# Patient Record
Sex: Male | Born: 1960 | Race: Black or African American | Hispanic: No | Marital: Married | Smoking: Former smoker
Health system: Southern US, Community
[De-identification: ages and names within clinical notes are randomized; demographics above are authoritative.]

## PROBLEM LIST (undated history)

## (undated) DIAGNOSIS — J189 Pneumonia, unspecified organism: Secondary | ICD-10-CM

## (undated) DIAGNOSIS — J984 Other disorders of lung: Secondary | ICD-10-CM

## (undated) DIAGNOSIS — F17200 Nicotine dependence, unspecified, uncomplicated: Secondary | ICD-10-CM

## (undated) DIAGNOSIS — M5136 Other intervertebral disc degeneration, lumbar region: Secondary | ICD-10-CM

## (undated) DIAGNOSIS — K219 Gastro-esophageal reflux disease without esophagitis: Secondary | ICD-10-CM

## (undated) DIAGNOSIS — M1990 Unspecified osteoarthritis, unspecified site: Secondary | ICD-10-CM

## (undated) HISTORY — DX: Unspecified osteoarthritis, unspecified site: M19.90

## (undated) HISTORY — DX: Gastro-esophageal reflux disease without esophagitis: K21.9

## (undated) HISTORY — DX: Other intervertebral disc degeneration, lumbar region: M51.36

## (undated) HISTORY — DX: Nicotine dependence, unspecified, uncomplicated: F17.200

---

## 2001-04-09 ENCOUNTER — Emergency Department (HOSPITAL_COMMUNITY): Admission: EM | Admit: 2001-04-09 | Discharge: 2001-04-09 | Payer: Self-pay | Admitting: Emergency Medicine

## 2001-04-09 ENCOUNTER — Encounter: Payer: Self-pay | Admitting: Emergency Medicine

## 2002-08-09 HISTORY — PX: BACK SURGERY: SHX140

## 2003-03-12 ENCOUNTER — Encounter: Payer: Self-pay | Admitting: Internal Medicine

## 2003-03-12 ENCOUNTER — Ambulatory Visit (HOSPITAL_COMMUNITY): Admission: RE | Admit: 2003-03-12 | Discharge: 2003-03-12 | Payer: Self-pay | Admitting: Internal Medicine

## 2003-04-09 ENCOUNTER — Encounter: Payer: Self-pay | Admitting: Internal Medicine

## 2003-04-09 ENCOUNTER — Ambulatory Visit (HOSPITAL_COMMUNITY): Admission: RE | Admit: 2003-04-09 | Discharge: 2003-04-09 | Payer: Self-pay | Admitting: Internal Medicine

## 2003-06-13 ENCOUNTER — Inpatient Hospital Stay (HOSPITAL_COMMUNITY): Admission: RE | Admit: 2003-06-13 | Discharge: 2003-06-14 | Payer: Self-pay | Admitting: Orthopedic Surgery

## 2003-12-07 ENCOUNTER — Emergency Department (HOSPITAL_COMMUNITY): Admission: EM | Admit: 2003-12-07 | Discharge: 2003-12-07 | Payer: Self-pay | Admitting: Emergency Medicine

## 2004-02-12 ENCOUNTER — Ambulatory Visit (HOSPITAL_COMMUNITY): Admission: RE | Admit: 2004-02-12 | Discharge: 2004-02-12 | Payer: Self-pay | Admitting: Internal Medicine

## 2004-02-19 ENCOUNTER — Ambulatory Visit (HOSPITAL_COMMUNITY): Admission: RE | Admit: 2004-02-19 | Discharge: 2004-02-19 | Payer: Self-pay | Admitting: Internal Medicine

## 2004-06-01 ENCOUNTER — Emergency Department (HOSPITAL_COMMUNITY): Admission: EM | Admit: 2004-06-01 | Discharge: 2004-06-02 | Payer: Self-pay | Admitting: Emergency Medicine

## 2004-06-01 ENCOUNTER — Ambulatory Visit: Payer: Self-pay | Admitting: *Deleted

## 2004-06-16 ENCOUNTER — Ambulatory Visit: Payer: Self-pay | Admitting: Family Medicine

## 2004-07-07 ENCOUNTER — Ambulatory Visit: Payer: Self-pay | Admitting: Family Medicine

## 2004-07-14 ENCOUNTER — Ambulatory Visit: Payer: Self-pay | Admitting: Family Medicine

## 2004-07-26 ENCOUNTER — Emergency Department (HOSPITAL_COMMUNITY): Admission: EM | Admit: 2004-07-26 | Discharge: 2004-07-26 | Payer: Self-pay | Admitting: Family Medicine

## 2004-09-02 ENCOUNTER — Ambulatory Visit: Payer: Self-pay | Admitting: Family Medicine

## 2004-09-14 ENCOUNTER — Ambulatory Visit: Payer: Self-pay | Admitting: Family Medicine

## 2004-09-24 ENCOUNTER — Ambulatory Visit: Payer: Self-pay | Admitting: Family Medicine

## 2004-11-02 ENCOUNTER — Ambulatory Visit: Payer: Self-pay | Admitting: Family Medicine

## 2004-11-13 ENCOUNTER — Ambulatory Visit: Payer: Self-pay | Admitting: Family Medicine

## 2004-11-26 ENCOUNTER — Ambulatory Visit: Payer: Self-pay | Admitting: Family Medicine

## 2004-12-11 ENCOUNTER — Ambulatory Visit: Payer: Self-pay | Admitting: Family Medicine

## 2004-12-15 ENCOUNTER — Emergency Department (HOSPITAL_COMMUNITY): Admission: EM | Admit: 2004-12-15 | Discharge: 2004-12-15 | Payer: Self-pay | Admitting: Emergency Medicine

## 2004-12-16 ENCOUNTER — Ambulatory Visit: Payer: Self-pay | Admitting: Family Medicine

## 2005-01-05 ENCOUNTER — Encounter: Admission: RE | Admit: 2005-01-05 | Discharge: 2005-01-05 | Payer: Self-pay | Admitting: Orthopedic Surgery

## 2005-01-25 ENCOUNTER — Ambulatory Visit: Payer: Self-pay | Admitting: Family Medicine

## 2005-03-02 ENCOUNTER — Ambulatory Visit: Payer: Self-pay | Admitting: Family Medicine

## 2005-03-09 ENCOUNTER — Ambulatory Visit: Payer: Self-pay | Admitting: Family Medicine

## 2005-03-20 ENCOUNTER — Emergency Department (HOSPITAL_COMMUNITY): Admission: AD | Admit: 2005-03-20 | Discharge: 2005-03-20 | Payer: Self-pay | Admitting: Family Medicine

## 2005-03-23 ENCOUNTER — Ambulatory Visit: Payer: Self-pay | Admitting: Family Medicine

## 2005-03-26 ENCOUNTER — Emergency Department (HOSPITAL_COMMUNITY): Admission: EM | Admit: 2005-03-26 | Discharge: 2005-03-26 | Payer: Self-pay | Admitting: Family Medicine

## 2005-09-11 ENCOUNTER — Emergency Department (HOSPITAL_COMMUNITY): Admission: EM | Admit: 2005-09-11 | Discharge: 2005-09-11 | Payer: Self-pay | Admitting: Emergency Medicine

## 2005-12-14 ENCOUNTER — Emergency Department (HOSPITAL_COMMUNITY): Admission: EM | Admit: 2005-12-14 | Discharge: 2005-12-14 | Payer: Self-pay | Admitting: Family Medicine

## 2006-01-10 ENCOUNTER — Emergency Department (HOSPITAL_COMMUNITY): Admission: EM | Admit: 2006-01-10 | Discharge: 2006-01-10 | Payer: Self-pay | Admitting: Family Medicine

## 2006-03-09 ENCOUNTER — Emergency Department (HOSPITAL_COMMUNITY): Admission: EM | Admit: 2006-03-09 | Discharge: 2006-03-09 | Payer: Self-pay | Admitting: Emergency Medicine

## 2007-11-13 ENCOUNTER — Emergency Department (HOSPITAL_COMMUNITY): Admission: EM | Admit: 2007-11-13 | Discharge: 2007-11-13 | Payer: Self-pay | Admitting: Emergency Medicine

## 2008-01-23 ENCOUNTER — Emergency Department (HOSPITAL_COMMUNITY): Admission: EM | Admit: 2008-01-23 | Discharge: 2008-01-23 | Payer: Self-pay | Admitting: Emergency Medicine

## 2008-10-28 ENCOUNTER — Emergency Department (HOSPITAL_COMMUNITY): Admission: EM | Admit: 2008-10-28 | Discharge: 2008-10-28 | Payer: Self-pay | Admitting: Emergency Medicine

## 2009-08-14 ENCOUNTER — Emergency Department (HOSPITAL_COMMUNITY): Admission: EM | Admit: 2009-08-14 | Discharge: 2009-08-14 | Payer: Self-pay | Admitting: Emergency Medicine

## 2009-09-26 ENCOUNTER — Emergency Department (HOSPITAL_COMMUNITY): Admission: EM | Admit: 2009-09-26 | Discharge: 2009-09-26 | Payer: Self-pay | Admitting: Family Medicine

## 2010-06-25 ENCOUNTER — Emergency Department (HOSPITAL_COMMUNITY): Admission: EM | Admit: 2010-06-25 | Discharge: 2010-06-25 | Payer: Self-pay | Admitting: Emergency Medicine

## 2010-06-27 ENCOUNTER — Emergency Department (HOSPITAL_COMMUNITY): Admission: EM | Admit: 2010-06-27 | Discharge: 2010-06-27 | Payer: Self-pay | Admitting: Emergency Medicine

## 2010-10-19 ENCOUNTER — Other Ambulatory Visit (HOSPITAL_COMMUNITY): Payer: Self-pay | Admitting: Family Medicine

## 2010-10-19 DIAGNOSIS — M545 Low back pain: Secondary | ICD-10-CM

## 2010-10-20 LAB — URINALYSIS, ROUTINE W REFLEX MICROSCOPIC
Bilirubin Urine: NEGATIVE
Bilirubin Urine: NEGATIVE
Glucose, UA: NEGATIVE mg/dL
Glucose, UA: NEGATIVE mg/dL
Hgb urine dipstick: NEGATIVE
Ketones, ur: NEGATIVE mg/dL
Ketones, ur: NEGATIVE mg/dL
Leukocytes, UA: NEGATIVE
Nitrite: NEGATIVE
Nitrite: NEGATIVE
Protein, ur: NEGATIVE mg/dL
Protein, ur: NEGATIVE mg/dL
Specific Gravity, Urine: 1.021 (ref 1.005–1.030)
Specific Gravity, Urine: 1.027 (ref 1.005–1.030)
Urobilinogen, UA: 0.2 mg/dL (ref 0.0–1.0)
Urobilinogen, UA: 0.2 mg/dL (ref 0.0–1.0)
pH: 6 (ref 5.0–8.0)
pH: 6 (ref 5.0–8.0)

## 2010-10-20 LAB — HEPATIC FUNCTION PANEL
ALT: 24 U/L (ref 0–53)
AST: 26 U/L (ref 0–37)
Albumin: 3.6 g/dL (ref 3.5–5.2)
Alkaline Phosphatase: 49 U/L (ref 39–117)
Bilirubin, Direct: 0.2 mg/dL (ref 0.0–0.3)
Indirect Bilirubin: 0.4 mg/dL (ref 0.3–0.9)
Total Bilirubin: 0.6 mg/dL (ref 0.3–1.2)
Total Protein: 6.2 g/dL (ref 6.0–8.3)

## 2010-10-20 LAB — BASIC METABOLIC PANEL
BUN: 6 mg/dL (ref 6–23)
CO2: 31 mEq/L (ref 19–32)
Calcium: 8.8 mg/dL (ref 8.4–10.5)
Chloride: 106 mEq/L (ref 96–112)
Creatinine, Ser: 0.93 mg/dL (ref 0.4–1.5)
GFR calc Af Amer: >60 mL/min (ref >60)
GFR calc non Af Amer: >60 mL/min (ref >60)
Glucose, Bld: 99 mg/dL (ref 70–99)
Potassium: 3.9 mEq/L (ref 3.5–5.1)
Sodium: 141 mEq/L (ref 135–145)

## 2010-10-20 LAB — CBC
HCT: 39.7 % (ref 39.0–52.0)
Hemoglobin: 14.3 g/dL (ref 13.0–17.0)
MCH: 32.2 pg (ref 26.0–34.0)
MCHC: 36 g/dL (ref 30.0–36.0)
MCV: 89.4 fL (ref 78.0–100.0)
Platelets: 262 10*3/uL (ref 150–400)
RBC: 4.44 MIL/uL (ref 4.22–5.81)
RDW: 12.9 % (ref 11.5–15.5)
WBC: 9.4 10*3/uL (ref 4.0–10.5)

## 2010-10-20 LAB — URINE MICROSCOPIC-ADD ON

## 2010-10-20 LAB — URINE CULTURE
Colony Count: NO GROWTH
Culture  Setup Time: 201111191258
Culture: NO GROWTH

## 2010-10-20 LAB — DIFFERENTIAL
Basophils Absolute: 0 10*3/uL (ref 0.0–0.1)
Basophils Relative: 0 % (ref 0–1)
Eosinophils Absolute: 0.2 10*3/uL (ref 0.0–0.7)
Eosinophils Relative: 2 % (ref 0–5)
Lymphocytes Relative: 26 % (ref 12–46)
Lymphs Abs: 2.4 10*3/uL (ref 0.7–4.0)
Monocytes Absolute: 0.6 10*3/uL (ref 0.1–1.0)
Monocytes Relative: 7 % (ref 3–12)
Neutro Abs: 6.2 10*3/uL (ref 1.7–7.7)
Neutrophils Relative %: 66 % (ref 43–77)

## 2010-10-20 LAB — RAPID URINE DRUG SCREEN, HOSP PERFORMED
Amphetamines: NOT DETECTED
Barbiturates: NOT DETECTED
Benzodiazepines: POSITIVE — AB
Cocaine: NOT DETECTED
Opiates: POSITIVE — AB
Tetrahydrocannabinol: NOT DETECTED

## 2010-10-20 LAB — LIPASE, BLOOD: Lipase: 19 U/L (ref 11–59)

## 2010-10-22 ENCOUNTER — Ambulatory Visit (HOSPITAL_COMMUNITY)
Admission: RE | Admit: 2010-10-22 | Discharge: 2010-10-22 | Disposition: A | Discharge status: Home / Self Care | Payer: Self-pay | Source: Ambulatory Visit | Attending: Family Medicine | Admitting: Family Medicine

## 2010-10-22 DIAGNOSIS — M545 Low back pain, unspecified: Secondary | ICD-10-CM | POA: Insufficient documentation

## 2010-10-22 DIAGNOSIS — M48061 Spinal stenosis, lumbar region without neurogenic claudication: Secondary | ICD-10-CM | POA: Insufficient documentation

## 2010-10-22 DIAGNOSIS — R209 Unspecified disturbances of skin sensation: Secondary | ICD-10-CM | POA: Insufficient documentation

## 2010-10-22 DIAGNOSIS — M4804 Spinal stenosis, thoracic region: Secondary | ICD-10-CM | POA: Insufficient documentation

## 2010-10-22 DIAGNOSIS — M4310 Spondylolisthesis, site unspecified: Secondary | ICD-10-CM | POA: Insufficient documentation

## 2010-10-22 DIAGNOSIS — Q446 Cystic disease of liver: Secondary | ICD-10-CM | POA: Insufficient documentation

## 2010-10-22 LAB — CREATININE, SERUM
Creatinine, Ser: 1.2 mg/dL (ref 0.4–1.5)
GFR calc Af Amer: >60 mL/min (ref >60)
GFR calc non Af Amer: >60 mL/min (ref >60)

## 2010-10-22 MED ORDER — GADOBENATE DIMEGLUMINE 529 MG/ML IV SOLN
15.0000 mL | Freq: Once | INTRAVENOUS | Status: AC | PRN
  Administered 2010-10-22: 15 mL via INTRAVENOUS

## 2010-11-19 LAB — RPR: RPR Ser Ql: NONREACTIVE

## 2010-11-19 LAB — GC/CHLAMYDIA PROBE AMP, GENITAL: GC Probe Amp, Genital: POSITIVE — AB

## 2010-12-25 NOTE — Op Note (Signed)
NAME:  Joshua Baldwin, Joshua Baldwin                       ACCOUNT NO.:  [id number]   MEDICAL RECORD NO.:  [id number]                   PATIENT TYPE:  AMB   LOCATION:  DAY                                  FACILITY:  [facility]   PHYSICIAN:  Georges Lynch. Gioffre, M.D.             DATE OF BIRTH:  [birth year]   DATE OF PROCEDURE:  06/12/2003  DATE OF DISCHARGE:                                 OPERATIVE REPORT   PREOPERATIVE DIAGNOSES:  1. Spinal stenosis, L3-4.  2. 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  He had bilateral leg pain     preoperatively.   POSTOPERATIVE DIAGNOSES:  1. Spinal stenosis, L3-4.  2. 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  He had bilateral leg pain     preoperatively.   OPERATION:  Central decompression at L3-4 with a microdiskectomy bilaterally  at L3-4.   SURGEON:  Georges Lynch. Darrelyn Hillock, M.D.   ASSISTANT:  Jene Every, M.D.   DESCRIPTION OF PROCEDURE:  Under general anesthesia, the patient in a spinal  frame, he first had 1 g of IV Ancef.  A routine orthopedic prep and drape of  the back was carried out.  At this time two needles were placed in the back  for localization purposes, x-ray was taken.  Following that, incision was  made over L3-4.  The incision was carried down through to the laminae and  spinous processes.  I then took another x-ray and we had then identified the  exact L3-4 level.  I then went down and did a central decompression at L3-4  after the self-retaining McCullough retractors were inserted.  Following  this we protected the dura with the cottonoids and then removed the  ligamentum flavum in the usual fashion.  At this particular time we  identified a nerve root, gently retracted the root and the dura.  We did  foraminotomies.  We identified the disk space bilaterally and did  microdiskectomies bilaterally at L3-4.  Note he had a huge amount of disk  material that migrated proximally over the body of L3.  We teased that out  with a nerve hook and  then the Epstein curettes.  We then went down in the  disk space and completed a microdiskectomy.  The nerve roots and the dura  now were free.  We thoroughly irrigated out the area.  We loosely applied  some thrombin-soaked Gelfoam.  I inserted a quarter-inch Penrose drain and  at this time I closed the wound in layers in the usual fashion but I left  the proximal deep portion of the wound open for drainage purposes.  The  remaining subcu and skin were closed in the usual fashion.  A sterile  Neosporin dressing was applied an the patient left the operating room in  satisfactory condition.  Ronald A. Darrelyn Hillock, M.D.    RAG/MEDQ  D:  06/12/2003  T:  06/12/2003  Job:  [id number]

## 2011-05-04 LAB — POCT URINALYSIS DIP (DEVICE)
Hgb urine dipstick: NEGATIVE
Protein, ur: 30 — AB
pH: 6

## 2011-09-24 ENCOUNTER — Encounter (HOSPITAL_COMMUNITY): Payer: Self-pay | Admitting: Emergency Medicine

## 2011-09-24 ENCOUNTER — Emergency Department (HOSPITAL_COMMUNITY)
Admission: EM | Admit: 2011-09-24 | Discharge: 2011-09-24 | Disposition: A | Discharge status: Home / Self Care | Payer: Medicaid Other | Attending: Emergency Medicine | Admitting: Emergency Medicine

## 2011-09-24 DIAGNOSIS — M545 Low back pain, unspecified: Secondary | ICD-10-CM | POA: Insufficient documentation

## 2011-09-24 DIAGNOSIS — G8929 Other chronic pain: Secondary | ICD-10-CM | POA: Insufficient documentation

## 2011-09-24 DIAGNOSIS — M25559 Pain in unspecified hip: Secondary | ICD-10-CM | POA: Insufficient documentation

## 2011-09-24 DIAGNOSIS — R61 Generalized hyperhidrosis: Secondary | ICD-10-CM | POA: Insufficient documentation

## 2011-09-24 DIAGNOSIS — F17200 Nicotine dependence, unspecified, uncomplicated: Secondary | ICD-10-CM | POA: Insufficient documentation

## 2011-09-24 DIAGNOSIS — R209 Unspecified disturbances of skin sensation: Secondary | ICD-10-CM | POA: Insufficient documentation

## 2011-09-24 MED ORDER — KETOROLAC TROMETHAMINE 60 MG/2ML IM SOLN
60.0000 mg | Freq: Once | INTRAMUSCULAR | Status: AC
  Administered 2011-09-24: 60 mg via INTRAMUSCULAR
  Filled 2011-09-24: qty 2

## 2011-09-24 MED ORDER — INDOMETHACIN 25 MG PO CAPS: 25.0000 mg | ORAL_CAPSULE | Freq: Two times a day (BID) | ORAL | Status: DC

## 2011-09-24 MED ORDER — HYDROCODONE-ACETAMINOPHEN 5-325 MG PO TABS: 1.0000 | ORAL_TABLET | ORAL | Status: AC | PRN

## 2011-09-24 NOTE — ED Provider Notes (Signed)
History     CSN: [id number]  Arrival date & time 09/24/11  0915   First MD Initiated Contact with Patient 09/24/11 [phone]      Chief Complaint  Patient presents with  . Back Pain    (Consider location/radiation/quality/duration/timing/severity/associated sxs/prior treatment) HPI History provided by pt.   Pt presents w/ chronic low back pain.  Is out of his pain medication and next scheduled appointment w/ his orthopedist in 2 months.  Pain diffuse low back w/ radiation to bilateral hips and associated RLE paresthesias.  Denies fever but has had night sweats (has been addressed by his doctor) and denies bladder/bowel dysfunction and lower extremity weakness.  Has been taking vicodin for pain with some relief but this tears up his stomach.     Past Medical History  Diagnosis Date  . Back pain     Past Surgical History  Procedure Date  . Back surgery     No family history on file.  History  Substance Use Topics  . Smoking status: Current Everyday Smoker  . Smokeless tobacco: Not on file  . Alcohol Use: Yes      Review of Systems  All other systems reviewed and are negative.    Allergies  Review of patient's allergies indicates no known allergies.  Home Medications   Current Outpatient Rx  Name Route Sig Dispense Refill  . HYDROCODONE-ACETAMINOPHEN 5-325 MG PO TABS Oral Take 1 tablet by mouth every 6 (six) hours as needed. For pain    . PREDNISONE (PAK) 5 MG PO TABS Oral Take 5 mg by mouth daily. Dose for 6 days.  Taper dose      BP 117/81  Pulse 96  Temp(Src) 98.9 F (37.2 C) (Oral)  Resp 20  SpO2 100%  Physical Exam  Nursing note and vitals reviewed. Constitutional: He is oriented to person, place, and time. He appears well-developed and well-nourished.  HENT:  Head: Normocephalic and atraumatic.  Eyes:       Normal appearance  Neck: Normal range of motion.  Cardiovascular: Normal rate and regular rhythm.   Pulmonary/Chest: Effort normal and breath  sounds normal.  Musculoskeletal:       Surgical scar midline lumbar spine.  Lumbar spinal and paraspinal ttp. No CVA ttp. Full ROM of LE.  Nml patellar reflexes.  No saddle anesthesia. Distal sensation intact.  2+ DP pulses.  Ambulates w/out diffulty.   Neurological: He is alert and oriented to person, place, and time.  Skin: Skin is warm and dry. No rash noted.  Psychiatric: He has a normal mood and affect. His behavior is normal.    ED Course  Procedures (including critical care time)  Labs Reviewed - No data to display No results found.   1. Chronic low back pain       MDM  Pt presents w/ acute on chronic low back pain.  His wife is being seen for same.  Sees Dr. Juliene Pina.  I have called his office and pt most recently seen 09/06/11 and d/c'd home w/ indomethacin.  He does not have a f/u appt scheduled.  Pt received IM toradol in ED and d/c'd home w/ 12 vicodin.  Recommended f/u with ortho as well as his PCP ([facility].  He is in the process of finding a pain specialist.  Return precautions discussed.         Arie Sabina [place], [place] 09/24/11 [phone]

## 2011-09-24 NOTE — ED Notes (Signed)
Ran out of pain meds for back last week cannot get into see dr for 2 months

## 2011-09-24 NOTE — Discharge Instructions (Signed)
Take indomethacin as prescribed.  Take vicodin as prescribed for severe pain.   Do not drive within four hours of taking this medication (may cause drowsiness or confusion).  Follow up with Dr. Juliene Pina and/or your family doctor and the pain specialist as soon as possible.  You should return to the ER if you develop at temperature greater than 100.5, leg weakness or loss of control of bladder/bowels.

## 2011-09-24 NOTE — ED Notes (Signed)
Patient states history of lower back pain with surgery. States had a MRI of his back in 2012.  Currently pain lower back 710 achy sharp and ran out of pain medication.  Unable to see surgeon for 2 months and pain is worsening overtime.  Denies incontinence intermittent tingling bilateral hands and feet.  Airway intact bilateral equal chest rise and fall. Ax4.

## 2011-09-24 NOTE — ED Provider Notes (Signed)
Medical screening examination/treatment/procedure(s) were performed by non-physician practitioner and as supervising physician I was immediately available for consultation/collaboration.   Louis Gaw, MD 09/24/11 2004 

## 2011-10-18 ENCOUNTER — Emergency Department (HOSPITAL_COMMUNITY)
Admission: EM | Admit: 2011-10-18 | Discharge: 2011-10-18 | Disposition: A | Discharge status: Home / Self Care | Payer: Medicaid Other | Attending: Emergency Medicine | Admitting: Emergency Medicine

## 2011-10-18 ENCOUNTER — Encounter (HOSPITAL_COMMUNITY): Payer: Self-pay | Admitting: *Deleted

## 2011-10-18 DIAGNOSIS — M25559 Pain in unspecified hip: Secondary | ICD-10-CM | POA: Insufficient documentation

## 2011-10-18 DIAGNOSIS — F17200 Nicotine dependence, unspecified, uncomplicated: Secondary | ICD-10-CM | POA: Insufficient documentation

## 2011-10-18 DIAGNOSIS — M549 Dorsalgia, unspecified: Secondary | ICD-10-CM

## 2011-10-18 MED ORDER — OXYCODONE-ACETAMINOPHEN 5-325 MG PO TABS: 1.0000 | ORAL_TABLET | ORAL | Status: AC | PRN

## 2011-10-18 NOTE — ED Notes (Signed)
States took one of mom's oxycodone last night and helped with pain. Has taken hydrocodone in past without relief.

## 2011-10-18 NOTE — ED Notes (Signed)
Pt c/o right hip pain, has had previous back surgery and having sciatica pain in lower back and hip. Seen Dr. Myra Rude for pain, given steroid pack, can't get appt with MD April 28th, unable to tolerate pain.

## 2011-10-18 NOTE — Discharge Instructions (Signed)
FOLLOW UP WITH YOUR DOCTOR FOR FURTHER MANAGEMENT OF CHRONIC PAIN. PERCOCET AS PRESCRIBED.

## 2011-10-18 NOTE — Progress Notes (Signed)
10/18/11 confirmed Dr Darrelyn Hillock is primary care provider/did his surgery. Pending disability for a court date on next Wednesday October 27 2011 and then he will get pain management provider for his back pain

## 2011-10-18 NOTE — ED Provider Notes (Signed)
History     CSN: [id number]  Arrival date & time 10/18/11  1139   First MD Initiated Contact with Patient 10/18/11 1231      Chief Complaint  Patient presents with  . Hip Pain    (Consider location/radiation/quality/duration/timing/severity/associated sxs/prior treatment) Patient is a 51 y.o. male presenting with hip pain. The history is provided by the patient.  Hip Pain This is a chronic problem. The problem occurs constantly. The problem has been unchanged. Pertinent negatives include no chills or fever. Associated symptoms comments: History of back surgery with chronic pain that radiates to right hip and buttock. No numbness, weakness. No new injury.. The symptoms are aggravated by walking, twisting and standing.    Past Medical History  Diagnosis Date  . Back pain     Past Surgical History  Procedure Date  . Back surgery     No family history on file.  History  Substance Use Topics  . Smoking status: Current Everyday Smoker  . Smokeless tobacco: Not on file  . Alcohol Use: Yes      Review of Systems  Constitutional: Negative for fever and chills.  HENT: Negative.   Respiratory: Negative.   Cardiovascular: Negative.   Gastrointestinal: Negative.   Musculoskeletal: Negative.        See HPI  Skin: Negative.   Neurological: Negative.     Allergies  Review of patient's allergies indicates no known allergies.  Home Medications   Current Outpatient Rx  Name Route Sig Dispense Refill  . VITAMIN B12 PO Oral Take 1 tablet by mouth daily.    . OXYCODONE-ACETAMINOPHEN 5-325 MG PO TABS Oral Take 0.5 tablets by mouth once.      BP 114/56  Pulse 84  Temp 99 F (37.2 C)  Resp 18  Ht 5\' 5"  (1.651 m)  Wt 156 lb (70.761 kg)  BMI 25.96 kg/m2  SpO2 99%  Physical Exam  Constitutional: He is oriented to person, place, and time. He appears well-developed and well-nourished.  Neck: Normal range of motion.  Pulmonary/Chest: Effort normal.  Abdominal: Soft. He  exhibits no mass. There is no tenderness.  Musculoskeletal: Normal range of motion. He exhibits no edema.       Right paralumbar tenderness without swelling, discoloration. Sciatic tenderness on right. Distal pulses 2+.  Neurological: He is alert and oriented to person, place, and time. He has normal reflexes. No sensory deficit.  Skin: Skin is warm and dry.  Psychiatric: He has a normal mood and affect.    ED Course  Procedures (including critical care time)  Labs Reviewed - No data to display No results found.   No diagnosis found.    MDM          Rodena Medin, PA-C 10/18/11 1316

## 2011-11-04 NOTE — ED Provider Notes (Signed)
Evaluation and management procedures were performed by the PA/NP under my supervision/collaboration.   Felisa Bonier, MD 11/04/11 1011

## 2011-12-29 ENCOUNTER — Emergency Department (HOSPITAL_COMMUNITY)
Admission: EM | Admit: 2011-12-29 | Discharge: 2011-12-29 | Disposition: A | Discharge status: Home / Self Care | Payer: Self-pay | Attending: Emergency Medicine | Admitting: Emergency Medicine

## 2011-12-29 ENCOUNTER — Encounter (HOSPITAL_COMMUNITY): Payer: Self-pay

## 2011-12-29 ENCOUNTER — Emergency Department (HOSPITAL_COMMUNITY): Payer: Self-pay

## 2011-12-29 DIAGNOSIS — M25559 Pain in unspecified hip: Secondary | ICD-10-CM | POA: Insufficient documentation

## 2011-12-29 DIAGNOSIS — F17200 Nicotine dependence, unspecified, uncomplicated: Secondary | ICD-10-CM | POA: Insufficient documentation

## 2011-12-29 MED ORDER — METHOCARBAMOL 100 MG/ML IJ SOLN
1000.0000 mg | Freq: Once | INTRAVENOUS | Status: DC
  Filled 2011-12-29: qty 10

## 2011-12-29 MED ORDER — METHOCARBAMOL 100 MG/ML IJ SOLN
1000.0000 mg | Freq: Once | INTRAMUSCULAR | Status: DC
  Filled 2011-12-29: qty 10

## 2011-12-29 MED ORDER — PREDNISONE 10 MG PO TABS: 20.0000 mg | ORAL_TABLET | Freq: Every day | ORAL | Status: DC

## 2011-12-29 MED ORDER — PREDNISONE 20 MG PO TABS
60.0000 mg | ORAL_TABLET | Freq: Once | ORAL | Status: AC
  Administered 2011-12-29: 60 mg via ORAL
  Filled 2011-12-29: qty 3

## 2011-12-29 MED ORDER — METHOCARBAMOL 500 MG PO TABS: 500.0000 mg | ORAL_TABLET | Freq: Two times a day (BID) | ORAL | Status: AC

## 2011-12-29 NOTE — ED Notes (Signed)
Pt c/o siactic pain to lt hip, states hx of back surgery 2004, denies new injury

## 2011-12-29 NOTE — ED Notes (Signed)
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 Has had severe pain radiating from hip to knee. Has had back surgery on lumbar spine by Dr. Darrelyn Hillock- 2004, has had right hip pain in past with cortisone injections three years ago. Has had recent low back pain, with CT scan, MRI, and treatment with prednisone --October 2012.

## 2011-12-29 NOTE — ED Provider Notes (Signed)
History     CSN: [id number]  Arrival date & time 12/29/11  0802   First MD Initiated Contact with Patient 12/29/11 [phone]      Chief Complaint  Patient presents with  . Hip Pain    (Consider location/radiation/quality/duration/timing/severity/associated sxs/prior treatment) HPI 51 y.o. Male with history of chronic right hip pain here with worsened left hip pain since moving fax machine yesterday.  Pain is sharp in lateral left hip and radiates to left midlateral lower thigh.  Pain began yesterday and is constant. Pain is 8-10/10.  No associated symptoms- denies weakness or numbness.  Took hot showers and flexeril and pain is slightly better now. Patient seen in March for pain in right hip.  Past Medical History  Diagnosis Date  . Back pain     Past Surgical History  Procedure Date  . Back surgery   . Back surgery     History reviewed. No pertinent family history.  History  Substance Use Topics  . Smoking status: Current Some Day Smoker  . Smokeless tobacco: Not on file  . Alcohol Use: No      Review of Systems  All other systems reviewed and are negative.    Allergies  Review of patient's allergies indicates no known allergies.  Home Medications   Current Outpatient Rx  Name Route Sig Dispense Refill  . VITAMIN B12 PO Oral Take 1 tablet by mouth daily.      BP 130/75  Pulse 78  Temp(Src) 99 F (37.2 C) (Oral)  Resp 16  SpO2 99%  Physical Exam  Nursing note and vitals reviewed. Constitutional: He is oriented to person, place, and time. He appears well-developed and well-nourished.  HENT:  Head: Normocephalic and atraumatic.  Right Ear: External ear normal.  Left Ear: External ear normal.  Nose: Nose normal.  Mouth/Throat: Oropharynx is clear and moist.  Eyes: Conjunctivae and EOM are normal. Pupils are equal, round, and reactive to light.  Neck: Normal range of motion. Neck supple.  Cardiovascular: Normal rate, regular rhythm, normal heart sounds  and intact distal pulses.   Pulmonary/Chest: Effort normal and breath sounds normal.  Abdominal: Soft. Bowel sounds are normal.  Musculoskeletal: Normal range of motion.  Neurological: He is alert and oriented to person, place, and time. He has normal reflexes.  Skin: Skin is warm and dry.  Psychiatric: He has a normal mood and affect. His behavior is normal. Thought content normal.    ED Course  Procedures (including critical care time)  Labs Reviewed - No data to display No results found.   No diagnosis found.   Dg Hip Complete Left  12/29/2011  *RADIOLOGY REPORT*  Clinical Data: Left hip pain.  LEFT HIP - COMPLETE 2+ VIEW  Comparison: None.  Findings: Mild superior joint space narrowing, subchondral sclerosis and cyst formation bilaterally.  No fracture or dislocation.  Minimal left collar osteophytosis.  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  No acute findings.  Original Report Authenticated By: Reyes Ivan, M.D.   MDM  Patient without acute abnormality is seen on hip x-Duru Reiger. He is advised to followup with his orthopedist. This may represent pain from his back. He is placed on prednisone and Robaxin.       Hilario Quarry, MD 12/29/11 [phone]

## 2011-12-29 NOTE — Discharge Instructions (Signed)
Hip Pain  The hips join the upper legs to the lower pelvis. The bones, cartilage, tendons, and muscles of the hip joint perform a lot of work each day holding your body weight and allowing you to move around.  Hip pain is a common symptom. It can range from a minor ache to severe pain on 1 or both hips. Pain may be felt on the inside of the hip joint near the groin, or the outside near the buttocks and upper thigh. There may be swelling or stiffness as well. It occurs more often when a person walks or performs activity. There are many reasons hip pain can develop.  CAUSES   It is important to work with your caregiver to identify the cause since many conditions can impact the bones, cartilage, muscles, and tendons of the hips. Causes for hip pain include:   Broken (fractured) bones.   Separation of the thighbone from the hip socket (dislocation).   Torn cartilage of the hip joint.   Swelling (inflammation) of a tendon (tendonitis), the sac within the hip joint (bursitis), or a joint.   A weakening in the abdominal wall (hernia), affecting the nerves to the hip.   Arthritis in the hip joint or lining of the hip joint.   Pinched nerves in the back, hip, or upper thigh.   A bulging disc in the spine (herniated disc).   Rarely, bone infection or cancer.  DIAGNOSIS   The location of your hip pain will help your caregiver understand what may be causing the pain. A diagnosis is based on your medical history, your symptoms, results from your physical exam, and results from diagnostic tests. Diagnostic tests may include X-Rayven Rettig exams, a computerized magnetic scan (magnetic resonance imaging, MRI), or bone scan.  TREATMENT   Treatment will depend on the cause of your hip pain. Treatment may include:   Limiting activities and resting until symptoms improve.   Crutches or other walking supports (a cane or brace).   Ice, elevation, and compression.   Physical therapy or home exercises.   Shoe inserts or special  shoes.   Losing weight.   Medications to reduce pain.   Undergoing surgery.  HOME CARE INSTRUCTIONS    Only take over-the-counter or prescription medicines for pain, discomfort, or fever as directed by your caregiver.   Put ice on the injured area:   Put ice in a plastic bag.   Place a towel between your skin and the bag.   Leave the ice on for 15 to 20 minutes at a time, 3 to 4 times a day.   Keep your leg raised (elevated) when possible to lessen swelling.   Avoid activities that cause pain.   Follow specific exercises as directed by your caregiver.   Sleep with a pillow between your legs on your most comfortable side.   Record how often you have hip pain, the location of the pain, and what it feels like. This information may be helpful to you and your caregiver.   Ask your caregiver about returning to work or sports and whether you should drive.   Follow up with your caregiver for further exams, therapy, or testing as directed.  SEEK MEDICAL CARE IF:    Your pain or swelling continues or worsens after 1 week.   You are feeling unwell or have chills.   You have increasing difficulty with walking.   You have a loss of sensation or other new symptoms.   You have questions   or concerns.  SEEK IMMEDIATE MEDICAL CARE IF:    You cannot put weight on the affected hip.   You have fallen.   You have a sudden increase in pain and swelling in your hip.   You have a fever.  MAKE SURE YOU:    Understand these instructions.   Will watch your condition.   Will get help right away if you are not doing well or get worse.  Document Released: 01/13/2010 Document Revised: 07/15/2011 Document Reviewed: 01/13/2010  [employer] Patient Information 2012 [employer]

## 2012-05-12 DIAGNOSIS — M5124 Other intervertebral disc displacement, thoracic region: Secondary | ICD-10-CM | POA: Insufficient documentation

## 2012-06-20 ENCOUNTER — Other Ambulatory Visit: Payer: Self-pay | Admitting: Neurosurgery

## 2012-06-20 DIAGNOSIS — M48061 Spinal stenosis, lumbar region without neurogenic claudication: Secondary | ICD-10-CM

## 2012-06-27 ENCOUNTER — Ambulatory Visit
Admission: RE | Admit: 2012-06-27 | Discharge: 2012-06-27 | Disposition: A | Discharge status: Home / Self Care | Payer: Medicaid Other | Source: Ambulatory Visit | Attending: Neurosurgery | Admitting: Neurosurgery

## 2012-06-27 VITALS — BP 102/61 | HR 59

## 2012-06-27 DIAGNOSIS — M48061 Spinal stenosis, lumbar region without neurogenic claudication: Secondary | ICD-10-CM

## 2012-06-27 MED ORDER — ONDANSETRON HCL 4 MG/2ML IJ SOLN
4.0000 mg | Freq: Once | INTRAMUSCULAR | Status: AC
  Administered 2012-06-27: 4 mg via INTRAMUSCULAR

## 2012-06-27 MED ORDER — DIAZEPAM 5 MG PO TABS
5.0000 mg | ORAL_TABLET | Freq: Once | ORAL | Status: AC
  Administered 2012-06-27: 5 mg via ORAL

## 2012-06-27 MED ORDER — IOHEXOL 180 MG/ML  SOLN: 17.0000 mL | Freq: Once | INTRAMUSCULAR | Status: AC | PRN

## 2012-06-27 MED ORDER — MEPERIDINE HCL 100 MG/ML IJ SOLN
75.0000 mg | Freq: Once | INTRAMUSCULAR | Status: AC
  Administered 2012-06-27: 75 mg via INTRAMUSCULAR

## 2013-02-24 ENCOUNTER — Emergency Department (HOSPITAL_COMMUNITY)
Admission: EM | Admit: 2013-02-24 | Discharge: 2013-02-24 | Disposition: A | Discharge status: Home / Self Care | Payer: Medicaid Other | Attending: Emergency Medicine | Admitting: Emergency Medicine

## 2013-02-24 ENCOUNTER — Emergency Department (HOSPITAL_COMMUNITY): Payer: Medicaid Other

## 2013-02-24 ENCOUNTER — Encounter (HOSPITAL_COMMUNITY): Payer: Self-pay | Admitting: Emergency Medicine

## 2013-02-24 DIAGNOSIS — Z792 Long term (current) use of antibiotics: Secondary | ICD-10-CM | POA: Insufficient documentation

## 2013-02-24 DIAGNOSIS — R109 Unspecified abdominal pain: Secondary | ICD-10-CM

## 2013-02-24 DIAGNOSIS — K7689 Other specified diseases of liver: Secondary | ICD-10-CM | POA: Insufficient documentation

## 2013-02-24 DIAGNOSIS — F17200 Nicotine dependence, unspecified, uncomplicated: Secondary | ICD-10-CM | POA: Insufficient documentation

## 2013-02-24 DIAGNOSIS — R1084 Generalized abdominal pain: Secondary | ICD-10-CM | POA: Insufficient documentation

## 2013-02-24 LAB — COMPREHENSIVE METABOLIC PANEL
ALT: 40 U/L (ref 0–53)
BUN: 31 mg/dL — ABNORMAL HIGH (ref 6–23)
CO2: 27 mEq/L (ref 19–32)
Calcium: 9.3 mg/dL (ref 8.4–10.5)
Creatinine, Ser: 1.23 mg/dL (ref 0.50–1.35)
GFR calc Af Amer: 77 mL/min — ABNORMAL LOW (ref >90)
GFR calc non Af Amer: 66 mL/min — ABNORMAL LOW (ref >90)
Glucose, Bld: 119 mg/dL — ABNORMAL HIGH (ref 70–99)

## 2013-02-24 LAB — LIPASE, BLOOD: Lipase: 22 U/L (ref 11–59)

## 2013-02-24 LAB — CBC WITH DIFFERENTIAL/PLATELET
Eosinophils Relative: 3 % (ref 0–5)
HCT: 42.7 % (ref 39.0–52.0)
Lymphocytes Relative: 29 % (ref 12–46)
Lymphs Abs: 3.7 10*3/uL (ref 0.7–4.0)
MCH: 32 pg (ref 26.0–34.0)
MCV: 87 fL (ref 78.0–100.0)
Monocytes Absolute: 0.8 10*3/uL (ref 0.1–1.0)
Monocytes Relative: 7 % (ref 3–12)
RBC: 4.91 MIL/uL (ref 4.22–5.81)
WBC: 12.9 10*3/uL — ABNORMAL HIGH (ref 4.0–10.5)

## 2013-02-24 LAB — URINALYSIS, ROUTINE W REFLEX MICROSCOPIC
Hgb urine dipstick: NEGATIVE
Protein, ur: NEGATIVE mg/dL
Urobilinogen, UA: 0.2 mg/dL (ref 0.0–1.0)

## 2013-02-24 MED ORDER — OXYCODONE-ACETAMINOPHEN 5-325 MG PO TABS: 1.0000 | ORAL_TABLET | ORAL | Status: DC | PRN

## 2013-02-24 MED ORDER — SODIUM CHLORIDE 0.9 % IV SOLN
Freq: Once | INTRAVENOUS | Status: AC
  Administered 2013-02-24: 02:00:00 via INTRAVENOUS

## 2013-02-24 MED ORDER — IOHEXOL 300 MG/ML  SOLN
100.0000 mL | Freq: Once | INTRAMUSCULAR | Status: AC | PRN
  Administered 2013-02-24: 100 mL via INTRAVENOUS

## 2013-02-24 MED ORDER — ONDANSETRON HCL 4 MG PO TABS: 4.0000 mg | ORAL_TABLET | Freq: Four times a day (QID) | ORAL | Status: DC | PRN

## 2013-02-24 MED ORDER — IOHEXOL 300 MG/ML  SOLN
25.0000 mL | Freq: Once | INTRAMUSCULAR | Status: AC | PRN
  Administered 2013-02-24: 25 mL via ORAL

## 2013-02-24 NOTE — ED Notes (Signed)
The pt returned from xray 

## 2013-02-24 NOTE — ED Notes (Signed)
The pt has gone to c-t 

## 2013-02-24 NOTE — ED Notes (Signed)
The pt is resting  He feels better

## 2013-02-24 NOTE — ED Notes (Signed)
Pt reports spider bite to R leg x 2 weeks.  States he has been using Cortisone cream and Epsom salt without any improvement.  Reports cramping to arms, legs, and abd since yesterday.  Abscess noted to R lower leg- pt reports yellow drainage yesterday.

## 2013-02-24 NOTE — ED Provider Notes (Signed)
History    CSN: [id number] Arrival date & time 02/24/13  0012  First MD Initiated Contact with Patient 02/24/13 [phone]     Chief Complaint  Patient presents with  . Insect Bite   (Consider location/radiation/quality/duration/timing/severity/associated sxs/prior Treatment) The history is provided by the patient.   52 year old male who comes in because of abdominal pain. He was bitten by a brown recluse spider about 2 weeks ago and has been followed with his PCP. 2 days ago, he started having generalized abdominal pain which she described as crampy. Pain is moderately severe and he rates it at 8/10. There is associated nausea without vomiting. He has not had any diarrhea. He denies fever chills or sweats. Appetite has been diminished. He denies any dysuria. He is also noted some cramping in his thighs. Nothing makes symptoms better nothing makes them worse.  Past Medical History  Diagnosis Date  . Back pain    Past Surgical History  Procedure Laterality Date  . Back surgery    . Back surgery     No family history on file. History  Substance Use Topics  . Smoking status: Current Some Day Smoker  . Smokeless tobacco: Not on file  . Alcohol Use: No    Review of Systems  All other systems reviewed and are negative.    Allergies  Review of patient's allergies indicates no known allergies.  Home Medications   Current Outpatient Rx  Name  Route  Sig  Dispense  Refill  . amoxicillin (AMOXIL) 500 MG capsule   Oral   Take 500 mg by mouth 3 (three) times daily.         Marland Kitchen HYDROcodone-acetaminophen (NORCO) 10-325 MG per tablet   Oral   Take 1 tablet by mouth every 6 (six) hours as needed for pain.          BP 101/68  Pulse 85  Temp(Src) 98 F (36.7 C) (Oral)  Resp 16  SpO2 97% Physical Exam  Nursing note and vitals reviewed.  52 year old male, resting comfortably and in no acute distress. Vital signs are normal. Oxygen saturation is 97%, which is normal. Head is  normocephalic and atraumatic. PERRLA, EOMI. Oropharynx is clear. Neck is nontender and supple without adenopathy or JVD. Back is nontender and there is no CVA tenderness. Lungs are clear without rales, wheezes, or rhonchi. Chest is nontender. Heart has regular rate and rhythm without murmur. Abdomen is soft, flat, and tender diffusely. There is no rebound or guarding. Worst tenderness is in the right lower abdomen. There is tenderness to percussion over the right lower abdomen. There are no masses or hepatosplenomegaly and peristalsis is hypoactive. Extremities have no cyanosis or edema, full range of motion is present. The right lower leg has a raised area which is slightly indurated with a central necrotic area about 3 mm across. There is no fluctuance and no surrounding erythema. It does not have the appearance of acute abscess. Skin is warm and dry without rash. Neurologic: Mental status is normal, cranial nerves are intact, there are no motor or sensory deficits.  ED Course  Procedures (including critical care time) Results for orders placed during the hospital encounter of 02/24/13  CBC WITH DIFFERENTIAL      Result Value Range   WBC 12.9 (*) 4.0 - 10.5 K/uL   RBC 4.91  4.22 - 5.81 MIL/uL   Hemoglobin 15.7  13.0 - 17.0 g/dL   HCT 87.5  64.3 - 32.9 %  MCV 87.0  78.0 - 100.0 fL   MCH 32.0  26.0 - 34.0 pg   MCHC 36.8 (*) 30.0 - 36.0 g/dL   RDW 16.1  09.6 - 04.5 %   Platelets 280  150 - 400 K/uL   Neutrophils Relative % 61  43 - 77 %   Neutro Abs 7.8 (*) 1.7 - 7.7 K/uL   Lymphocytes Relative 29  12 - 46 %   Lymphs Abs 3.7  0.7 - 4.0 K/uL   Monocytes Relative 7  3 - 12 %   Monocytes Absolute 0.8  0.1 - 1.0 K/uL   Eosinophils Relative 3  0 - 5 %   Eosinophils Absolute 0.4  0.0 - 0.7 K/uL   Basophils Relative 1  0 - 1 %   Basophils Absolute 0.1  0.0 - 0.1 K/uL  COMPREHENSIVE METABOLIC PANEL      Result Value Range   Sodium 137  135 - 145 mEq/L   Potassium 4.5  3.5 - 5.1 mEq/L    Chloride 99  96 - 112 mEq/L   CO2 27  19 - 32 mEq/L   Glucose, Bld 119 (*) 70 - 99 mg/dL   BUN 31 (*) 6 - 23 mg/dL   Creatinine, Ser 4.09  0.50 - 1.35 mg/dL   Calcium 9.3  8.4 - 81.1 mg/dL   Total Protein 7.8  6.0 - 8.3 g/dL   Albumin 3.7  3.5 - 5.2 g/dL   AST 71 (*) 0 - 37 U/L   ALT 40  0 - 53 U/L   Alkaline Phosphatase 76  39 - 117 U/L   Total Bilirubin 0.5  0.3 - 1.2 mg/dL   GFR calc non Af Amer 66 (*) >90 mL/min   GFR calc Af Amer 77 (*) >90 mL/min  LIPASE, BLOOD      Result Value Range   Lipase 22  11 - 59 U/L  URINALYSIS, ROUTINE W REFLEX MICROSCOPIC      Result Value Range   Color, Urine YELLOW  YELLOW   APPearance CLEAR  CLEAR   Specific Gravity, Urine 1.026  1.005 - 1.030   pH 5.5  5.0 - 8.0   Glucose, UA NEGATIVE  NEGATIVE mg/dL   Hgb urine dipstick NEGATIVE  NEGATIVE   Bilirubin Urine SMALL (*) NEGATIVE   Ketones, ur NEGATIVE  NEGATIVE mg/dL   Protein, ur NEGATIVE  NEGATIVE mg/dL   Urobilinogen, UA 0.2  0.0 - 1.0 mg/dL   Nitrite NEGATIVE  NEGATIVE   Leukocytes, UA NEGATIVE  NEGATIVE   Ct Abdomen Pelvis W Contrast  02/24/2013   *RADIOLOGY REPORT*  Clinical Data: Right-sided abdominal cramping, nausea and vomiting.  CT ABDOMEN AND PELVIS WITH CONTRAST  Technique:  Multidetector CT imaging of the abdomen and pelvis was performed following the standard protocol during bolus administration of intravenous contrast.  Contrast: OMNIPAQUE IOHEXOL 300 MG/ML  SOLN  Comparison: CT of the abdomen and pelvis performed 06/27/2010, and CT of the lumbar spine performed 06/27/2012  Findings: Minimal bibasilar atelectasis is noted.  Numerous hepatic cysts are again seen, measuring up to 4.3 cm in size.  Some of these have increased significantly in size from the prior study.  The liver is otherwise grossly unremarkable in appearance, though difficult to fully assess given the number of hepatic cysts.  The spleen is unremarkable in appearance.  The gallbladder is relatively decompressed  and within normal limits.  The pancreas and adrenal glands are grossly unremarkable.  The kidneys are  unremarkable in appearance.  There is no evidence of hydronephrosis.  No renal or ureteral stones are seen.  No perinephric stranding is appreciated.  No free fluid is identified.  The small bowel is unremarkable in appearance.  The stomach is within normal limits.  No acute vascular abnormalities are seen.  The appendix is normal in caliber and contains air, without evidence for appendicitis.  The colon is grossly unremarkable in appearance.  The bladder is largely decompressed and grossly unremarkable in appearance.  The prostate remains normal in size.  No inguinal lymphadenopathy is seen.  No acute osseous abnormalities are identified.  Endplate sclerotic change at L2, L3 and L4 is grossly unchanged from the prior CT of the lumbar spine; there is minimal grade 1 anterolisthesis of L3 on L4, as on the prior study.  The patient is status post laminectomy at L3.  IMPRESSION:  1.  No acute abnormality seen within the abdomen or pelvis. 2.  Numerous hepatic cysts have increased in size from the prior study. 3.  Degenerative change along the lumbar spine, as on prior studies.   Original Report Authenticated By: Tonia Ghent, M.D.   Images viewed by me.  1. Abdominal pain   2. Hepatic cyst     MDM  Spread of the right leg which seems to be healing appropriately. Abdominal pain which I do not believe is related to the spider bite. I am concerned about possibility of appendicitis and he sent for CT scan.  CT does not show any evidence of appendicitis or other acute process. Hepatic cysts are noted which have increased in size from last study. He is referred back to PCP for followup. Prescription is given for oxycodone acetaminophen and ondansetron  Dione Booze, MD 02/24/13 [phone]

## 2013-02-24 NOTE — ED Notes (Signed)
The pt has multiple complaints he has abd and chest pain and cramps all over his body and he thinks he has been  Bitten by spiders on his chest.  He had knots in his chest also .  He has seen his regular doctor and the lesion on his rt lower leg has been there  For 3 weeks.  He has also had more body cramps than usual

## 2013-03-07 ENCOUNTER — Other Ambulatory Visit: Payer: Self-pay | Admitting: Neurosurgery

## 2013-03-22 ENCOUNTER — Other Ambulatory Visit: Payer: Self-pay | Admitting: Neurosurgery

## 2013-03-22 DIAGNOSIS — Q762 Congenital spondylolisthesis: Secondary | ICD-10-CM

## 2013-03-26 ENCOUNTER — Ambulatory Visit
Admission: RE | Admit: 2013-03-26 | Discharge: 2013-03-26 | Disposition: A | Discharge status: Home / Self Care | Payer: Medicaid Other | Source: Ambulatory Visit | Attending: Neurosurgery | Admitting: Neurosurgery

## 2013-03-26 ENCOUNTER — Other Ambulatory Visit: Payer: Medicaid Other

## 2013-03-26 DIAGNOSIS — Q762 Congenital spondylolisthesis: Secondary | ICD-10-CM

## 2013-03-26 MED ORDER — GADOBENATE DIMEGLUMINE 529 MG/ML IV SOLN
14.0000 mL | Freq: Once | INTRAVENOUS | Status: AC | PRN
  Administered 2013-03-26: 14 mL via INTRAVENOUS

## 2013-05-02 ENCOUNTER — Encounter (HOSPITAL_COMMUNITY): Payer: Self-pay | Admitting: Pharmacy Technician

## 2013-05-04 NOTE — Pre-Procedure Instructions (Addendum)
Joshua Baldwin  05/04/2013   Your procedure is scheduled on:  05/15/13  Report to [place] [facility] short stay admitting at 530 AM.  Call this number if you have problems the morning of surgery: [phone]   Remember:   Do not eat food or drink liquids after midnight.   Take these medicines the morning of surgery with A SIP OF WATER: pain med if needed   Do not wear jewelry, make-up or nail polish.  Do not wear lotions, powders, or perfumes. You may wear deodorant.  Do not shave 48 hours prior to surgery. Men may shave face and neck.  Do not bring valuables to the hospital.  [facility] is not responsible                  for any belongings or valuables.               Contacts, dentures or bridgework may not be worn into surgery.  Leave suitcase in the car. After surgery it may be brought to your room.  For patients admitted to the hospital, discharge time is determined by your                treatment team.               Patients discharged the day of surgery will not be allowed to drive  home.  Name and phone number of your driver:   Special Instructions: Shower using CHG 2 nights before surgery and the night before surgery.  If you shower the day of surgery use CHG.  Use special wash - you have one bottle of CHG for all showers.  You should use approximately 1/3 of the bottle for each shower.   Please read over the following fact sheets that you were given: Pain Booklet, Coughing and Deep Breathing, Blood Transfusion Information, MRSA Information and Surgical Site Infection Prevention

## 2013-05-07 ENCOUNTER — Encounter (HOSPITAL_COMMUNITY)
Admission: RE | Admit: 2013-05-07 | Discharge: 2013-05-07 | Disposition: A | Discharge status: Home / Self Care | Payer: Medicaid Other | Source: Ambulatory Visit | Attending: Neurosurgery | Admitting: Neurosurgery

## 2013-05-07 ENCOUNTER — Encounter (HOSPITAL_COMMUNITY): Payer: Self-pay

## 2013-05-07 ENCOUNTER — Ambulatory Visit (HOSPITAL_COMMUNITY)
Admission: RE | Admit: 2013-05-07 | Discharge: 2013-05-07 | Disposition: A | Discharge status: Home / Self Care | Payer: Medicaid Other | Source: Ambulatory Visit | Attending: Anesthesiology | Admitting: Anesthesiology

## 2013-05-07 DIAGNOSIS — Z01812 Encounter for preprocedural laboratory examination: Secondary | ICD-10-CM | POA: Insufficient documentation

## 2013-05-07 DIAGNOSIS — Z01818 Encounter for other preprocedural examination: Secondary | ICD-10-CM | POA: Insufficient documentation

## 2013-05-07 HISTORY — DX: Other disorders of lung: J98.4

## 2013-05-07 LAB — CBC
MCH: 31.3 pg (ref 26.0–34.0)
MCV: 87.4 fL (ref 78.0–100.0)
Platelets: 258 10*3/uL (ref 150–400)
RBC: 4.67 MIL/uL (ref 4.22–5.81)
RDW: 13 % (ref 11.5–15.5)
WBC: 9.9 10*3/uL (ref 4.0–10.5)

## 2013-05-07 LAB — TYPE AND SCREEN: Antibody Screen: NEGATIVE

## 2013-05-07 LAB — BASIC METABOLIC PANEL
CO2: 29 mEq/L (ref 19–32)
Calcium: 9.3 mg/dL (ref 8.4–10.5)
Chloride: 101 mEq/L (ref 96–112)
Creatinine, Ser: 0.99 mg/dL (ref 0.50–1.35)
GFR calc Af Amer: >90 mL/min (ref >90)
Sodium: 137 mEq/L (ref 135–145)

## 2013-05-07 LAB — ABO/RH: ABO/RH(D): O POS

## 2013-05-07 LAB — SURGICAL PCR SCREEN
MRSA, PCR: NEGATIVE
Staphylococcus aureus: NEGATIVE

## 2013-05-14 MED ORDER — CEFAZOLIN SODIUM-DEXTROSE 2-3 GM-% IV SOLR
2.0000 g | INTRAVENOUS | Status: AC
  Administered 2013-05-15 (×2): 2 g via INTRAVENOUS
  Filled 2013-05-14: qty 50

## 2013-05-14 MED ORDER — DEXAMETHASONE SODIUM PHOSPHATE 10 MG/ML IJ SOLN
10.0000 mg | INTRAMUSCULAR | Status: AC
  Administered 2013-05-15: 10 mg via INTRAVENOUS
  Filled 2013-05-14: qty 1

## 2013-05-15 ENCOUNTER — Inpatient Hospital Stay (HOSPITAL_COMMUNITY)
Admission: RE | Admit: 2013-05-15 | Discharge: 2013-05-18 | DRG: 460 | Disposition: A | Discharge status: Home / Self Care | Payer: Medicaid Other | Source: Ambulatory Visit | Attending: Neurosurgery | Admitting: Neurosurgery

## 2013-05-15 ENCOUNTER — Ambulatory Visit (HOSPITAL_COMMUNITY): Payer: Medicaid Other

## 2013-05-15 ENCOUNTER — Ambulatory Visit (HOSPITAL_COMMUNITY): Payer: Medicaid Other | Admitting: Certified Registered Nurse Anesthetist

## 2013-05-15 ENCOUNTER — Encounter (HOSPITAL_COMMUNITY): Payer: Self-pay | Admitting: Certified Registered Nurse Anesthetist

## 2013-05-15 ENCOUNTER — Encounter (HOSPITAL_COMMUNITY): Payer: Self-pay | Admitting: *Deleted

## 2013-05-15 ENCOUNTER — Encounter (HOSPITAL_COMMUNITY)
Admission: RE | Disposition: A | Discharge status: Home / Self Care | Payer: Medicaid Other | Source: Ambulatory Visit | Attending: Neurosurgery

## 2013-05-15 DIAGNOSIS — M5126 Other intervertebral disc displacement, lumbar region: Principal | ICD-10-CM | POA: Diagnosis present

## 2013-05-15 DIAGNOSIS — M48061 Spinal stenosis, lumbar region without neurogenic claudication: Secondary | ICD-10-CM

## 2013-05-15 DIAGNOSIS — F17200 Nicotine dependence, unspecified, uncomplicated: Secondary | ICD-10-CM | POA: Diagnosis present

## 2013-05-15 DIAGNOSIS — Q762 Congenital spondylolisthesis: Secondary | ICD-10-CM

## 2013-05-15 DIAGNOSIS — M129 Arthropathy, unspecified: Secondary | ICD-10-CM | POA: Diagnosis present

## 2013-05-15 DIAGNOSIS — Z981 Arthrodesis status: Secondary | ICD-10-CM

## 2013-05-15 SURGERY — POSTERIOR LUMBAR FUSION 3 LEVEL
Anesthesia: General | Site: Back | Wound class: Clean

## 2013-05-15 MED ORDER — PANTOPRAZOLE SODIUM 40 MG IV SOLR
40.0000 mg | Freq: Every day | INTRAVENOUS | Status: DC
  Administered 2013-05-15 – 2013-05-16 (×2): 40 mg via INTRAVENOUS
  Filled 2013-05-15 (×3): qty 40

## 2013-05-15 MED ORDER — ZOLPIDEM TARTRATE 5 MG PO TABS: 5.0000 mg | ORAL_TABLET | Freq: Every evening | ORAL | Status: DC | PRN

## 2013-05-15 MED ORDER — SODIUM CHLORIDE 0.9 % IV SOLN
INTRAVENOUS | Status: DC | PRN
  Administered 2013-05-15: 14:00:00 via INTRAVENOUS

## 2013-05-15 MED ORDER — DEXAMETHASONE 4 MG PO TABS
4.0000 mg | ORAL_TABLET | Freq: Four times a day (QID) | ORAL | Status: AC
  Administered 2013-05-15 (×2): 4 mg via ORAL
  Filled 2013-05-15 (×2): qty 1

## 2013-05-15 MED ORDER — MEPERIDINE HCL 25 MG/ML IJ SOLN: 6.2500 mg | INTRAMUSCULAR | Status: DC | PRN

## 2013-05-15 MED ORDER — ALBUMIN HUMAN 5 % IV SOLN
INTRAVENOUS | Status: DC | PRN
  Administered 2013-05-15: 10:00:00 via INTRAVENOUS

## 2013-05-15 MED ORDER — OXYCODONE HCL 5 MG PO TABS
ORAL_TABLET | ORAL | Status: AC
  Filled 2013-05-15: qty 1

## 2013-05-15 MED ORDER — ACETAMINOPHEN 325 MG PO TABS
650.0000 mg | ORAL_TABLET | ORAL | Status: DC | PRN
  Administered 2013-05-16: 325 mg via ORAL
  Filled 2013-05-15: qty 2

## 2013-05-15 MED ORDER — OXYCODONE HCL 5 MG PO TABS
5.0000 mg | ORAL_TABLET | Freq: Once | ORAL | Status: AC | PRN
  Administered 2013-05-15: 5 mg via ORAL

## 2013-05-15 MED ORDER — MIDAZOLAM HCL 2 MG/2ML IJ SOLN: 0.5000 mg | Freq: Once | INTRAMUSCULAR | Status: DC | PRN

## 2013-05-15 MED ORDER — SODIUM CHLORIDE 0.9 % IR SOLN
Status: DC | PRN
  Administered 2013-05-15: 09:00:00

## 2013-05-15 MED ORDER — GLYCOPYRROLATE 0.2 MG/ML IJ SOLN
INTRAMUSCULAR | Status: DC | PRN
  Administered 2013-05-15: .4 mg via INTRAVENOUS

## 2013-05-15 MED ORDER — OXYCODONE HCL 5 MG PO TABS: 5.0000 mg | ORAL_TABLET | Freq: Once | ORAL | Status: DC | PRN

## 2013-05-15 MED ORDER — DEXAMETHASONE SODIUM PHOSPHATE 4 MG/ML IJ SOLN: 4.0000 mg | Freq: Four times a day (QID) | INTRAMUSCULAR | Status: AC

## 2013-05-15 MED ORDER — 0.9 % SODIUM CHLORIDE (POUR BTL) OPTIME
TOPICAL | Status: DC | PRN
  Administered 2013-05-15: 1000 mL

## 2013-05-15 MED ORDER — NEOSTIGMINE METHYLSULFATE 1 MG/ML IJ SOLN
INTRAMUSCULAR | Status: DC | PRN
  Administered 2013-05-15: 3 mg via INTRAVENOUS

## 2013-05-15 MED ORDER — PROMETHAZINE HCL 25 MG/ML IJ SOLN: 6.2500 mg | INTRAMUSCULAR | Status: DC | PRN

## 2013-05-15 MED ORDER — HYDROMORPHONE HCL PF 1 MG/ML IJ SOLN
INTRAMUSCULAR | Status: AC
  Filled 2013-05-15: qty 1

## 2013-05-15 MED ORDER — SODIUM CHLORIDE 0.9 % IJ SOLN: 3.0000 mL | INTRAMUSCULAR | Status: DC | PRN

## 2013-05-15 MED ORDER — ROCURONIUM BROMIDE 100 MG/10ML IV SOLN
INTRAVENOUS | Status: DC | PRN
  Administered 2013-05-15 (×3): 10 mg via INTRAVENOUS
  Administered 2013-05-15: 50 mg via INTRAVENOUS
  Administered 2013-05-15 (×3): 10 mg via INTRAVENOUS
  Administered 2013-05-15: 20 mg via INTRAVENOUS
  Administered 2013-05-15 (×2): 10 mg via INTRAVENOUS

## 2013-05-15 MED ORDER — BUPIVACAINE HCL (PF) 0.5 % IJ SOLN
INTRAMUSCULAR | Status: DC | PRN
  Administered 2013-05-15: 30 mL

## 2013-05-15 MED ORDER — HYDROCODONE-ACETAMINOPHEN 5-325 MG PO TABS
1.0000 | ORAL_TABLET | ORAL | Status: DC | PRN
  Administered 2013-05-15 – 2013-05-16 (×5): 2 via ORAL
  Administered 2013-05-17: 1 via ORAL
  Filled 2013-05-15 (×2): qty 2
  Filled 2013-05-15: qty 1
  Filled 2013-05-15 (×3): qty 2

## 2013-05-15 MED ORDER — HYDROMORPHONE HCL PF 1 MG/ML IJ SOLN
1.0000 mg | INTRAMUSCULAR | Status: DC | PRN
  Administered 2013-05-15: 1 mg via INTRAMUSCULAR
  Administered 2013-05-16 – 2013-05-17 (×3): 1.5 mg via INTRAMUSCULAR
  Filled 2013-05-15: qty 2
  Filled 2013-05-15: qty 1
  Filled 2013-05-15 (×2): qty 2

## 2013-05-15 MED ORDER — HYDROMORPHONE HCL PF 1 MG/ML IJ SOLN
0.2500 mg | INTRAMUSCULAR | Status: DC | PRN
  Administered 2013-05-15 (×4): 0.5 mg via INTRAVENOUS

## 2013-05-15 MED ORDER — THROMBIN 20000 UNITS EX SOLR
CUTANEOUS | Status: DC | PRN
  Administered 2013-05-15 (×3): via TOPICAL

## 2013-05-15 MED ORDER — LIDOCAINE HCL (CARDIAC) 20 MG/ML IV SOLN
INTRAVENOUS | Status: DC | PRN
  Administered 2013-05-15: 40 mg via INTRAVENOUS

## 2013-05-15 MED ORDER — KCL IN DEXTROSE-NACL 20-5-0.45 MEQ/L-%-% IV SOLN
80.0000 mL/h | INTRAVENOUS | Status: DC
  Administered 2013-05-15: 80 mL/h via INTRAVENOUS
  Filled 2013-05-15 (×7): qty 1000

## 2013-05-15 MED ORDER — HYDROMORPHONE HCL PF 1 MG/ML IJ SOLN
INTRAMUSCULAR | Status: DC | PRN
  Administered 2013-05-15 (×2): 0.5 mg via INTRAVENOUS

## 2013-05-15 MED ORDER — ACETAMINOPHEN 650 MG RE SUPP: 650.0000 mg | RECTAL | Status: DC | PRN

## 2013-05-15 MED ORDER — ONDANSETRON HCL 4 MG/2ML IJ SOLN
INTRAMUSCULAR | Status: DC | PRN
  Administered 2013-05-15: 4 mg via INTRAMUSCULAR

## 2013-05-15 MED ORDER — MIDAZOLAM HCL 5 MG/5ML IJ SOLN
INTRAMUSCULAR | Status: DC | PRN
  Administered 2013-05-15: 2 mg via INTRAVENOUS

## 2013-05-15 MED ORDER — CYCLOBENZAPRINE HCL 10 MG PO TABS
10.0000 mg | ORAL_TABLET | Freq: Three times a day (TID) | ORAL | Status: DC | PRN
  Administered 2013-05-15 – 2013-05-17 (×4): 10 mg via ORAL
  Filled 2013-05-15 (×4): qty 1

## 2013-05-15 MED ORDER — OXYCODONE HCL 5 MG/5ML PO SOLN
5.0000 mg | Freq: Once | ORAL | Status: DC | PRN
Start: 2013-05-15 — End: 2013-05-15

## 2013-05-15 MED ORDER — MENTHOL 3 MG MT LOZG: 1.0000 | LOZENGE | OROMUCOSAL | Status: DC | PRN

## 2013-05-15 MED ORDER — SODIUM CHLORIDE 0.9 % IJ SOLN
3.0000 mL | Freq: Two times a day (BID) | INTRAMUSCULAR | Status: DC
  Administered 2013-05-15 – 2013-05-17 (×5): 3 mL via INTRAVENOUS

## 2013-05-15 MED ORDER — PROPOFOL 10 MG/ML IV BOLUS
INTRAVENOUS | Status: DC | PRN
  Administered 2013-05-15: 150 mg via INTRAVENOUS

## 2013-05-15 MED ORDER — HYDROMORPHONE HCL PF 1 MG/ML IJ SOLN: 0.2500 mg | INTRAMUSCULAR | Status: DC | PRN

## 2013-05-15 MED ORDER — CYCLOBENZAPRINE HCL 10 MG PO TABS
ORAL_TABLET | ORAL | Status: AC
  Filled 2013-05-15: qty 1

## 2013-05-15 MED ORDER — LACTATED RINGERS IV SOLN
INTRAVENOUS | Status: DC | PRN
  Administered 2013-05-15 (×3): via INTRAVENOUS

## 2013-05-15 MED ORDER — SODIUM CHLORIDE 0.9 % IV SOLN: 250.0000 mL | INTRAVENOUS | Status: DC

## 2013-05-15 MED ORDER — OXYCODONE HCL 5 MG/5ML PO SOLN: 5.0000 mg | Freq: Once | ORAL | Status: AC | PRN

## 2013-05-15 MED ORDER — CEFAZOLIN SODIUM-DEXTROSE 2-3 GM-% IV SOLR
INTRAVENOUS | Status: AC
  Filled 2013-05-15: qty 50

## 2013-05-15 MED ORDER — CEFAZOLIN SODIUM-DEXTROSE 2-3 GM-% IV SOLR
2.0000 g | Freq: Three times a day (TID) | INTRAVENOUS | Status: AC
  Administered 2013-05-15 – 2013-05-16 (×3): 2 g via INTRAVENOUS
  Filled 2013-05-15 (×3): qty 50

## 2013-05-15 MED ORDER — PHENOL 1.4 % MT LIQD: 1.0000 | OROMUCOSAL | Status: DC | PRN

## 2013-05-15 MED ORDER — ONDANSETRON HCL 4 MG/2ML IJ SOLN
4.0000 mg | INTRAMUSCULAR | Status: DC | PRN
  Administered 2013-05-17: 4 mg via INTRAVENOUS
  Filled 2013-05-15: qty 2

## 2013-05-15 MED ORDER — FENTANYL CITRATE 0.05 MG/ML IJ SOLN
INTRAMUSCULAR | Status: DC | PRN
  Administered 2013-05-15 (×2): 50 ug via INTRAVENOUS
  Administered 2013-05-15: 25 ug via INTRAVENOUS
  Administered 2013-05-15: 100 ug via INTRAVENOUS
  Administered 2013-05-15 (×3): 50 ug via INTRAVENOUS
  Administered 2013-05-15: 250 ug via INTRAVENOUS
  Administered 2013-05-15: 50 ug via INTRAVENOUS
  Administered 2013-05-15: 25 ug via INTRAVENOUS
  Administered 2013-05-15: 50 ug via INTRAVENOUS

## 2013-05-15 SURGICAL SUPPLY — 70 items
ADH SKN CLS APL DERMABOND .7 (GAUZE/BANDAGES/DRESSINGS) ×2
ADH SKN CLS LQ APL DERMABOND (GAUZE/BANDAGES/DRESSINGS) ×2
APL SKNCLS STERI-STRIP NONHPOA (GAUZE/BANDAGES/DRESSINGS) ×1
BAG DECANTER FOR FLEXI CONT (MISCELLANEOUS) ×2 IMPLANT
BENZOIN TINCTURE PRP APPL 2/3 (GAUZE/BANDAGES/DRESSINGS) ×3 IMPLANT
BLADE SURG ROTATE 9660 (MISCELLANEOUS) ×2 IMPLANT
BONE EQUIVA 10CC (Bone Implant) ×6 IMPLANT
BRUSH SCRUB EZ PLAIN DRY (MISCELLANEOUS) ×2 IMPLANT
BUR CUTTER 7.0 ROUND (BURR) ×4 IMPLANT
BUR MATCHSTICK NEURO 3.0 LAGG (BURR) ×2 IMPLANT
CANISTER SUCTION 2500CC (MISCELLANEOUS) ×2 IMPLANT
CLOTH BEACON ORANGE TIMEOUT ST (SAFETY) ×2 IMPLANT
CONT SPEC 4OZ CLIKSEAL STRL BL (MISCELLANEOUS) ×4 IMPLANT
COVER BACK TABLE 24X17X13 BIG (DRAPES) IMPLANT
COVER TABLE BACK 60X90 (DRAPES) ×2 IMPLANT
DERMABOND ADHESIVE PROPEN (GAUZE/BANDAGES/DRESSINGS) ×2
DERMABOND ADVANCED (GAUZE/BANDAGES/DRESSINGS) ×2
DERMABOND ADVANCED .7 DNX12 (GAUZE/BANDAGES/DRESSINGS) ×2 IMPLANT
DERMABOND ADVANCED .7 DNX6 (GAUZE/BANDAGES/DRESSINGS) ×2 IMPLANT
DRAPE C-ARM 42X72 X-RAY (DRAPES) ×8 IMPLANT
DRAPE LAPAROTOMY 100X72X124 (DRAPES) ×2 IMPLANT
DRAPE SURG 17X23 STRL (DRAPES) ×4 IMPLANT
DRESSING TELFA 8X3 (GAUZE/BANDAGES/DRESSINGS) ×2 IMPLANT
DRSG OPSITE POSTOP 4X10 (GAUZE/BANDAGES/DRESSINGS) ×2 IMPLANT
DURAPREP 26ML APPLICATOR (WOUND CARE) ×2 IMPLANT
ELECT REM PT RETURN 9FT ADLT (ELECTROSURGICAL) ×2
ELECTRODE REM PT RTRN 9FT ADLT (ELECTROSURGICAL) ×1 IMPLANT
EVACUATOR 1/8 PVC DRAIN (DRAIN) ×2 IMPLANT
GAUZE SPONGE 4X4 16PLY XRAY LF (GAUZE/BANDAGES/DRESSINGS) ×2 IMPLANT
GLOVE BIOGEL PI IND STRL 7.5 (GLOVE) IMPLANT
GLOVE BIOGEL PI IND STRL 8.5 (GLOVE) IMPLANT
GLOVE BIOGEL PI INDICATOR 7.5 (GLOVE) ×4
GLOVE BIOGEL PI INDICATOR 8.5 (GLOVE) ×1
GLOVE ECLIPSE 7.0 STRL STRAW (GLOVE) ×4 IMPLANT
GLOVE ECLIPSE 8.0 STRL XLNG CF (GLOVE) ×4 IMPLANT
GLOVE ECLIPSE 8.5 STRL (GLOVE) ×2 IMPLANT
GLOVE SURG SS PI 7.0 STRL IVOR (GLOVE) ×6 IMPLANT
GOWN BRE IMP SLV AUR LG STRL (GOWN DISPOSABLE) ×4 IMPLANT
GOWN BRE IMP SLV AUR XL STRL (GOWN DISPOSABLE) ×5 IMPLANT
GOWN STRL REIN 2XL LVL4 (GOWN DISPOSABLE) ×2 IMPLANT
IMPLANT PEEK ARDIS 8 X 8 X 26 (Orthopedic Implant) ×4 IMPLANT
KIT BASIN OR (CUSTOM PROCEDURE TRAY) ×2 IMPLANT
KIT ROOM TURNOVER OR (KITS) ×2 IMPLANT
NEEDLE HYPO 22GX1.5 SAFETY (NEEDLE) ×2 IMPLANT
NS IRRIG 1000ML POUR BTL (IV SOLUTION) ×2 IMPLANT
PACK LAMINECTOMY NEURO (CUSTOM PROCEDURE TRAY) ×2 IMPLANT
PAD ARMBOARD 7.5X6 YLW CONV (MISCELLANEOUS) ×6 IMPLANT
PATTIES SURGICAL .75X.75 (GAUZE/BANDAGES/DRESSINGS) ×2 IMPLANT
PEDIGUARD CURV (INSTRUMENTS) ×2 IMPLANT
PEEK OPTIMA 9X9X22 (Peek) ×4 IMPLANT
PEEK OPTIMA 9X9X26MM (Peek) ×4 IMPLANT
ROD PRE-BENT SEQUOIA 100MM (Rod) ×2 IMPLANT
SCREW POLY 6.5X40MM (Screw) ×8 IMPLANT
SPEEDLINK 11 MEDIUM (Rod) ×2 IMPLANT
SPONGE GAUZE 4X4 12PLY (GAUZE/BANDAGES/DRESSINGS) ×2 IMPLANT
SPONGE LAP 4X18 X RAY DECT (DISPOSABLE) IMPLANT
SPONGE SURGIFOAM ABS GEL 100 (HEMOSTASIS) ×2 IMPLANT
STRIP CLOSURE SKIN 1/2X4 (GAUZE/BANDAGES/DRESSINGS) ×4 IMPLANT
SUT VIC AB 0 CT1 18XCR BRD8 (SUTURE) ×1 IMPLANT
SUT VIC AB 0 CT1 8-18 (SUTURE) ×2
SUT VIC AB 2-0 OS6 18 (SUTURE) ×6 IMPLANT
SUT VIC AB 3-0 CP2 18 (SUTURE) ×2 IMPLANT
SYR 20ML ECCENTRIC (SYRINGE) ×2 IMPLANT
TAPE STRIPS DRAPE STRL (GAUZE/BANDAGES/DRESSINGS) ×2 IMPLANT
TOP CLSR SEQUOIA (Orthopedic Implant) ×16 IMPLANT
TOWEL OR 17X24 6PK STRL BLUE (TOWEL DISPOSABLE) ×2 IMPLANT
TOWEL OR 17X26 10 PK STRL BLUE (TOWEL DISPOSABLE) ×2 IMPLANT
TRAP SPECIMEN MUCOUS 40CC (MISCELLANEOUS) IMPLANT
TRAY FOLEY CATH 14FRSI W/METER (CATHETERS) ×2 IMPLANT
WATER STERILE IRR 1000ML POUR (IV SOLUTION) ×2 IMPLANT

## 2013-05-15 NOTE — Op Note (Signed)
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 Previous lumbar incision was opened and extended in both directions. We brought in fluoroscopy to identify our level at that time and then did a subperiosteal dissection on the spinous processes lamina facet joint and the far lateral region of L2 L3-L4 and L5. Previous surgery at L3-4 was identified as well as the residual lamina. Self-retaining tract was placed for exposure and x-ray showed approach the appropriate levels. Using the Leksell rongeur spinous processes of L2 L3-L4 and L5 were removed. Starting the patient's left side generous laminotomy with was performed at L2-3 L3-4 and L4-5 to the point where we had removed the entire lamina of L2-L3 and L4 superior half of L5. Residual bone and ligamentum flavum removed in a piecemeal fashion. Residual bone was saved for use later in the case. We then did a similar decompression on the right side and completed the midline decompression to complete the bilateral laminectomy at L2-3 and L3-4 and L4-5. Thecal sac was well decompressed as was the lateral recess with the L3-L4 and L5 nerve roots well visualized. At this time to perform microdiscectomy is bilaterally at all 3 levels. The patient particular attention to L4-5 was very with there is a large herniated disc with a superior fragment. A displaced cleanout was carried out while the same time great care was taken to avoid injury to the neural elements this was  successfully done. At this point inspection was carried out all directions for any evidence of residual compression and none could be identified. Irrigation was carried out this time. We then performed posterior lumbar interbody fusions at all 3 levels. We distracted L3 for a 20 mm size prepared the disc for interbody fusion. Placed a cage without difficulty. Prep placing the second cage we placed a mixture of autologous bone morselized allograft it within the interspace to help with interbody fusion. We then distracted the disc stability 3 at L4-5 to a 9 mm size and a similar interbody fusions as we got at L3-4. He can the endplates thoroughly and placed the initial cage filled a mixture of autologous bone morselized allograft. On the opposite side we did the same thing to prior to placing the second cage at all levels we placed a mixture of autologous bone morselized allograft into the interspace to help with interbody fusion. Fluoroscopy showed good position of the cages at all 3 levels. Irrigated copiously once more. Then placed pedicle screws in standard fashion. Drill hole for entry point we used the ultrasound guided pedicle probe to guarantee safe passage the pedicles. We tapped with a 5.5 mm tap and placed 6.5 x 40 mm screws bilaterally at all 4 levels. We then chose appropriate length rod and secured at the top of the screws and did tightening and final tightening with torque and counter torque and compressed the superior vertebra down towards the inferior vertebra at all 3 levels bilaterally. We then decorticated the far lateral region performed posterior lateral fusion with autologous bone morselized allograft and L2-3-4 and 5. We then placed a cross-link in standard fashion. Final  fluoroscopy in AP lateral direction showed good placed of the interbody spacers screws and rods. Irrigation was carried out once more and any bleeding control proper coagulation Gelfoam. A drain was left in the epidural space  and brought out through a separate stab incision. The was then closed in multiple layers of Vicryl on the muscle fascia subcutaneous and subcuticular tissues. Dermabond and Steri-Strips were placed on the skin. Shortness was then applied the patient was extubated and taken to recovery room in stable condition.

## 2013-05-15 NOTE — Preoperative (Signed)
Beta Blockers   Reason not to administer Beta Blockers:Not Applicable 

## 2013-05-15 NOTE — Anesthesia Procedure Notes (Signed)
Date/Time: 05/15/2013 8:13 AM Performed by: Rogelia Boga Pre-anesthesia Checklist: Patient identified, Emergency Drugs available, Suction available, Patient being monitored and Timeout performed Patient Re-evaluated:Patient Re-evaluated prior to inductionOxygen Delivery Method: Circle system utilized Preoxygenation: Pre-oxygenation with 100% oxygen Intubation Type: IV induction Ventilation: Mask ventilation without difficulty and Oral airway inserted - appropriate to patient size Laryngoscope Size: Mac and 4 Grade View: Grade I Tube type: Oral Tube size: 7.5 mm Number of attempts: 1 Airway Equipment and Method: Stylet Placement Confirmation: ETT inserted through vocal cords under direct vision,  positive ETCO2 and breath sounds checked- equal and bilateral Secured at: 21 cm Tube secured with: Tape Dental Injury: Teeth and Oropharynx as per pre-operative assessment

## 2013-05-15 NOTE — Anesthesia Preprocedure Evaluation (Addendum)
Anesthesia Evaluation  Patient identified by MRN, date of birth, ID band Patient awake    Reviewed: Allergy & Precautions, H&P , NPO status , Patient's Chart, lab work & pertinent test results, reviewed documented beta blocker date and time   History of Anesthesia Complications Negative for: history of anesthetic complications  Airway Mallampati: II TM Distance: >3 FB Neck ROM: Full    Dental  (+) Dental Advisory Given and Partial Upper   Pulmonary Current Smoker,  breath sounds clear to auscultation  Pulmonary exam normal       Cardiovascular negative cardio ROS  Rhythm:Regular Rate:Normal     Neuro/Psych Chronic back pain: narcotic dependent    GI/Hepatic negative GI ROS, Neg liver ROS,   Endo/Other  negative endocrine ROS  Renal/GU negative Renal ROS     Musculoskeletal   Abdominal   Peds  Hematology negative hematology ROS (+)   Anesthesia Other Findings   Reproductive/Obstetrics                           Anesthesia Physical Anesthesia Plan  ASA: II  Anesthesia Plan: General   Post-op Pain Management:    Induction: Intravenous  Airway Management Planned: Oral ETT  Additional Equipment:   Intra-op Plan:   Post-operative Plan: Extubation in OR  Informed Consent: I have reviewed the patients History and Physical, chart, labs and discussed the procedure including the risks, benefits and alternatives for the proposed anesthesia with the patient or authorized representative who has indicated his/her understanding and acceptance.   Dental advisory given  Plan Discussed with: CRNA, Surgeon and Anesthesiologist  Anesthesia Plan Comments: (Plan routine monitors, GETA)       Anesthesia Quick Evaluation

## 2013-05-15 NOTE — Progress Notes (Signed)
Pt reports that he drank 1/2 cup ("2 swallows") of coffee with cream and sugar at 0400 this morning. Pt reports that he was told to remain NPO after midnight. Dr. Ivin Booty informed as well as Neuro holding area. Dr. Ivin Booty reports that final decision will be up to Dr. Jean Rosenthal once she arrives around 0700. Pt also informed with understanding verbalized. Note also placed on front of chart.

## 2013-05-15 NOTE — Transfer of Care (Signed)
Immediate Anesthesia Transfer of Care Note  Patient: Joshua Baldwin  Procedure(s) Performed: Procedure(s) with comments: POSTERIOR LUMBAR FUSION 3 LEVEL (N/A) - POSTERIOR LUMBAR FUSION 3 LEVEL  Patient Location: PACU  Anesthesia Type:General  Level of Consciousness: awake, alert  and oriented  Airway & Oxygen Therapy: Patient Spontanous Breathing and Patient connected to face mask oxygen  Post-op Assessment: Report given to PACU RN  Post vital signs: Reviewed and stable  Complications: No apparent anesthesia complications

## 2013-05-15 NOTE — Anesthesia Postprocedure Evaluation (Signed)
  Anesthesia Post-op Note  Patient: Joshua Baldwin  Procedure(s) Performed: Procedure(s) with comments: POSTERIOR LUMBAR FUSION 3 LEVEL (N/A) - POSTERIOR LUMBAR FUSION 3 LEVEL  Patient Location: PACU  Anesthesia Type:General  Level of Consciousness: awake, alert , oriented and patient cooperative  Airway and Oxygen Therapy: Patient Spontanous Breathing and Patient connected to nasal cannula oxygen  Post-op Pain: mild  Post-op Assessment: Post-op Vital signs reviewed, Patient's Cardiovascular Status Stable, Respiratory Function Stable, Patent Airway, No signs of Nausea or vomiting and Pain level controlled  Post-op Vital Signs: Reviewed and stable  Complications: No apparent anesthesia complications

## 2013-05-15 NOTE — H&P (Signed)
  Joshua Baldwin is an 52 y.o. male.   Chief Complaint: Back and bilateral leg pain HPI: The patient is a 52 year old gentleman with a history of previous back surgery. He's been followed for almost a year with back and bilateral leg pain which has been progressively severe and not improved with numerous conservative therapies. He underwent imaging studies in the remote past and also recently which shows significant disease at L2-3 L3-4 and L4-5 with spondylolisthesis previous surgery and marked degenerative disease. After discussing the options the patient requested surgery and now comes for a three-level posterior lumbar interbody fusion with pedicle screw fixation. I had a long discussion with him regarding the risks and benefits of surgical intervention. The risks discussed include but are not limited to bleeding infection weakness numbness paralysis spinal fluid leak: Instrumentation nonunion coma and death. We have discussed alternative methods of therapy offered risks and benefits of nonintervention. He's had the opportunity to ask numerous questions and appears to understand. With this information in hand he has requested we proceed with surgery.  Past Medical History  Diagnosis Date  . Back pain   . 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 Arthritis     Past Surgical History  Procedure Laterality Date  . Back surgery  04    History reviewed. No pertinent family history. Social History:  reports that he has been smoking Cigarettes.  He has a 1.75 pack-year smoking history. He has never used smokeless tobacco. He reports that he does not drink alcohol or use illicit drugs.  Allergies: No Known Allergies  Medications Prior to Admission  Medication Sig Dispense Refill  . HYDROcodone-acetaminophen (NORCO) 10-325 MG per tablet Take 1 tablet by mouth every 6 (six) hours as needed for pain.      . Multiple Vitamin (MULTIVITAMIN WITH MINERALS) TABS  tablet Take 1 tablet by mouth daily.        No results found for this or any previous visit (from the past 48 hour(s)). No results found.  Review of systems not obtained due to patient factors.  Blood pressure 126/93, pulse 57, temperature 98.4 F (36.9 C), temperature source Oral, resp. rate 18, SpO2 100.00%.  The patient is awake or and oriented. His no facial asymmetry. His gait is slow mildly antalgic. He is decreased reflexes bilaterally. Strength however is 5 over 5 and sensation is intact Assessment/Plan Impression is that of marked disease at L2-3 L3-4 and L4-5 with spondylolisthesis previous surgery disc herniations. The plan is for a two-level interbody fusion with pedicle screw fixation.  Reinaldo Meeker, MD 05/15/2013, 8:02 AM

## 2013-05-16 MED ORDER — FLEET ENEMA 7-19 GM/118ML RE ENEM
1.0000 | ENEMA | Freq: Every day | RECTAL | Status: DC | PRN
  Administered 2013-05-16: 1 via RECTAL
  Filled 2013-05-16: qty 1

## 2013-05-16 MED FILL — Heparin Sodium (Porcine) Inj 1000 Unit/ML: INTRAMUSCULAR | Qty: 30 | Status: AC

## 2013-05-16 MED FILL — Sodium Chloride IV Soln 0.9%: INTRAVENOUS | Qty: 1000 | Status: AC

## 2013-05-16 NOTE — Progress Notes (Signed)
Pt. Accidentally pulled hemovac.  MD notified no orders given.

## 2013-05-16 NOTE — Evaluation (Signed)
Physical Therapy Evaluation Patient Details Name: Joshua Baldwin MRN: [id number] DOB: [birth year] Today's Date: 05/16/2013 Time: 4098-1191 PT Time Calculation (min): 18 min  PT Assessment / Plan / Recommendation History of Present Illness  pt presents with L2-5 PLIF.    Clinical Impression  Pt doing excellent.  Anticipate will continue to make great progress.      PT Assessment  Patient needs continued PT services    Follow Up Recommendations  No PT follow up    Does the patient have the potential to tolerate intense rehabilitation      Barriers to Discharge        Equipment Recommendations  None recommended by PT    Recommendations for Other Services     Frequency Min 5X/week    Precautions / Restrictions Precautions Precautions: Back Precaution Booklet Issued: Yes (comment) Required Braces or Orthoses: Spinal Brace Spinal Brace: Lumbar corset;Applied in sitting position Restrictions Weight Bearing Restrictions: No   Pertinent Vitals/Pain "Not as bad as it was."      Mobility  Bed Mobility Bed Mobility: Not assessed Transfers Transfers: Sit to Stand;Stand to Sit Sit to Stand: 6: Modified independent (Device/Increase time);With upper extremity assist;From chair/3-in-1 Stand to Sit: 6: Modified independent (Device/Increase time);With upper extremity assist;To chair/3-in-1 Details for Transfer Assistance: Needs UEs for A with controlling descent to chair.  Moves slowly.   Ambulation/Gait Ambulation/Gait Assistance: 5: Supervision Ambulation Distance (Feet): 200 Feet Assistive device: None Ambulation/Gait Assistance Details: pt moves slowly and cautiously. but demos safe mobility.  Cues for back precautions.   Gait Pattern: Step-through pattern;Decreased stride length Stairs: Yes Stairs Assistance: 5: Supervision Stairs Assistance Details (indicate cue type and reason): demos good technique.   Stair Management Technique: Two rails;Alternating  pattern;Forwards Number of Stairs: 5 Wheelchair Mobility Wheelchair Mobility: No    Exercises     PT Diagnosis: Difficulty walking  PT Problem List: Decreased activity tolerance;Decreased balance;Decreased mobility;Decreased knowledge of precautions;Pain PT Treatment Interventions: Gait training;Stair training;Functional mobility training;Therapeutic activities;Therapeutic exercise;Balance training;Patient/family education     PT Goals(Current goals can be found in the care plan section) Acute Rehab PT Goals Patient Stated Goal: Home PT Goal Formulation: With patient Time For Goal Achievement: 05/23/13 Potential to Achieve Goals: Good  Visit Information  Last PT Received On: 05/16/13 Assistance Needed: +1 History of Present Illness: pt presents with L2-5 PLIF.         Prior Functioning  Home Living Family/patient expects to be discharged to:: Private residence Living Arrangements: Spouse/significant other Available Help at Discharge: Family;Available 24 hours/day Type of Home: House Home Access: Stairs to enter [employer] of Steps: 3 Entrance Stairs-Rails: Right;Left;Can reach both Home Layout: One level Home Equipment: None Prior Function Level of Independence: Independent Communication Communication: No difficulties    Cognition  Cognition Arousal/Alertness: Awake/alert Behavior During Therapy: WFL for tasks assessed/performed Overall Cognitive Status: Within Functional Limits for tasks assessed    Extremity/Trunk Assessment Upper Extremity Assessment Upper Extremity Assessment: Defer to OT evaluation Lower Extremity Assessment Lower Extremity Assessment: Overall WFL for tasks assessed Cervical / Trunk Assessment Cervical / Trunk Assessment: Normal   Balance Balance Balance Assessed: Yes Static Standing Balance Static Standing - Balance Support: No upper extremity supported Static Standing - Level of Assistance: 6: Modified independent  (Device/Increase time)  End of Session PT - End of Session Equipment Utilized During Treatment: Gait belt;Back brace Activity Tolerance: Patient tolerated treatment well Patient left: in chair;with call bell/phone within reach;with family/visitor present Nurse Communication: Mobility status  GP  Sunny Schlein, [place] [phone] 05/16/2013, 8:28 AM

## 2013-05-16 NOTE — Evaluation (Signed)
Occupational Therapy Evaluation Patient Details Name: Joshua Baldwin MRN: [id number] DOB: 10/23/1960 Today's Date: 05/16/2013 Time: 4696-2952 OT Time Calculation (min): 27 min  OT Assessment / Plan / Recommendation History of present illness pt presents with L2-5 PLIF.     Clinical Impression   Pt doing very well during evaluation. Provided education to patient and spouse. Feel pt is safe to d/c home from OT standpoint as wife is available to assist 24/7.     OT Assessment  Patient does not need any further OT services    Follow Up Recommendations  No OT follow up;Supervision/Assistance - 24 hour    Barriers to Discharge      Equipment Recommendations  3 in 1 bedside comode    Recommendations for Other Services    Frequency       Precautions / Restrictions Precautions Precautions: Back Precaution Booklet Issued: Yes (comment) Required Braces or Orthoses: Spinal Brace Spinal Brace: Lumbar corset;Applied in sitting position Restrictions Weight Bearing Restrictions: No   Pertinent Vitals/Pain Pain but not rated. Repositioned.     ADL  Eating/Feeding: Independent Where Assessed - Eating/Feeding: Chair Grooming: Set up;Supervision/safety Where Assessed - Grooming: Supported sitting Upper Body Bathing: Set up;Supervision/safety Where Assessed - Upper Body Bathing: Supported sitting Lower Body Bathing: Moderate assistance Where Assessed - Lower Body Bathing: Unsupported sit to stand Upper Body Dressing: Set up;Supervision/safety Where Assessed - Upper Body Dressing: Supported sitting Lower Body Dressing: Moderate assistance Where Assessed - Lower Body Dressing: Unsupported sit to stand Toilet Transfer: Supervision/safety Toilet Transfer Method: Sit to stand Toilet Transfer Equipment: Regular height toilet;Grab bars;Raised toilet seat with arms (or 3-in-1 over toilet) Toileting - Clothing Manipulation and Hygiene: Other (comment) (see ADL section below) Tub/Shower  Transfer: Performed;Min guard Tub/Shower Transfer Method: Science writer: Shower seat without back Equipment Used: Gait belt;Back brace Transfers/Ambulation Related to ADLs: Min guard/supervision for ambulation.  ADL Comments: Pt practiced tub transfer at [facility] guard level. Recommended all the rugs be taken up and discussed safe shoewear. Pt/wife feels good about putting on back brace. Pt has reacher at home and pt says he knows how to use it-OT reviewed technique.  Pt simulated toileting hygiene while standing but he typically does this sitting from the front so explained use of toilet aid to assist with this to help maintain precautions. Educated on use of two cups for teeth care and also placing grooming supplies on left side of sink to help maintain precautions. Pt not interested in sock aid as wife will help with this.    OT Diagnosis:    OT Problem List:   OT Treatment Interventions:     OT Goals(Current goals can be found in the care plan section) Acute Rehab OT Goals Patient Stated Goal: Home  Visit Information  Last OT Received On: 05/16/13 Assistance Needed: +1 History of Present Illness: pt presents with L2-5 PLIF.         Prior Functioning     Home Living Family/patient expects to be discharged to:: Private residence Living Arrangements: Spouse/significant other Available Help at Discharge: Family;Available 24 hours/day Type of Home: House Home Access: Stairs to enter [employer] of Steps: 3 Entrance Stairs-Rails: Right;Left;Can reach both Home Layout: One level Home Equipment: Scientist, physiological Equipment: Reacher;Long-handled sponge Prior Function Level of Independence: Independent Communication Communication: No difficulties Dominant Hand: Left         Vision/Perception     Cognition  Cognition Arousal/Alertness: Awake/alert Behavior During Therapy: WFL for tasks assessed/performed Overall Cognitive  Status: Within Functional Limits for tasks assessed    Extremity/Trunk Assessment Upper Extremity Assessment Upper Extremity Assessment: Overall WFL for tasks assessed Lower Extremity Assessment Lower Extremity Assessment: Defer to PT evaluation Cervical / Trunk Assessment Cervical / Trunk Assessment: Normal     Mobility Bed Mobility Bed Mobility: Sit to Sidelying Right;Rolling Left;Rolling Right;Right Sidelying to Sit Rolling Right: 5: Supervision Rolling Left: 4: Min guard (tactile cue) Right Sidelying to Sit: 5: Supervision Sit to Sidelying Right: 5: Supervision Details for Bed Mobility Assistance: Cues for technique and precautions. Transfers Transfers: Sit to Stand;Stand to Sit Sit to Stand: 5: Supervision;With upper extremity assist;From chair/3-in-1;From bed;From toilet Stand to Sit: 5: Supervision;With upper extremity assist;To bed;To chair/3-in-1;To toilet Details for Transfer Assistance: cues for technique-practiced both on 3 in 1 and simulated regular height      Exercise   Balance   End of Session OT - End of Session Equipment Utilized During Treatment: Back brace;Gait belt Activity Tolerance: Patient tolerated treatment well Patient left: in chair;with call bell/phone within reach;with family/visitor present  GO     Earlie Raveling OTR/L [phone] 05/16/2013, 11:47 AM

## 2013-05-16 NOTE — Progress Notes (Signed)
Patient ID: Joshua Baldwin, male   DOB: 12-25-1960, 52 y.o.   MRN: [id number] Afeb, vss Feels great Minimal pain. Drain working well Already ambulated. Will increase activity today, plan d/c tomorrow.

## 2013-05-16 NOTE — Progress Notes (Signed)
Pt c/o of both back and stomach pain,said to be constipated,last BM was on Monday 6th, Dr Jeral Fruit (on call) paged and notified,ordered to give pt a fleet enema same given at 2002 alongside IM dilaudid 1.5mg  at 1958,pt reassured,will however continue to monitor. Obasogie-Asidi, Bronwyn Belasco Efe

## 2013-05-17 ENCOUNTER — Inpatient Hospital Stay (HOSPITAL_COMMUNITY): Payer: Medicaid Other

## 2013-05-17 LAB — CBC WITH DIFFERENTIAL/PLATELET
Basophils Relative: 0 % (ref 0–1)
Eosinophils Absolute: 0 10*3/uL (ref 0.0–0.7)
HCT: 31.2 % — ABNORMAL LOW (ref 39.0–52.0)
Hemoglobin: 11 g/dL — ABNORMAL LOW (ref 13.0–17.0)
MCH: 30.7 pg (ref 26.0–34.0)
MCHC: 35.3 g/dL (ref 30.0–36.0)
Monocytes Absolute: 1.3 10*3/uL — ABNORMAL HIGH (ref 0.1–1.0)
Monocytes Relative: 9 % (ref 3–12)
Neutrophils Relative %: 76 % (ref 43–77)
Platelets: 183 10*3/uL (ref 150–400)

## 2013-05-17 LAB — URINALYSIS, ROUTINE W REFLEX MICROSCOPIC
Bilirubin Urine: NEGATIVE
Hgb urine dipstick: NEGATIVE
Ketones, ur: NEGATIVE mg/dL
Leukocytes, UA: NEGATIVE
Protein, ur: NEGATIVE mg/dL
Urobilinogen, UA: 1 mg/dL (ref 0.0–1.0)

## 2013-05-17 MED ORDER — PANTOPRAZOLE SODIUM 40 MG PO TBEC
40.0000 mg | DELAYED_RELEASE_TABLET | Freq: Every day | ORAL | Status: DC
  Administered 2013-05-17 – 2013-05-18 (×2): 40 mg via ORAL
  Filled 2013-05-17 (×2): qty 1

## 2013-05-17 MED ORDER — OXYCODONE HCL 5 MG PO TABS
5.0000 mg | ORAL_TABLET | ORAL | Status: DC | PRN
  Administered 2013-05-17 – 2013-05-18 (×6): 10 mg via ORAL
  Filled 2013-05-17 (×6): qty 2

## 2013-05-17 MED ORDER — IBUPROFEN 400 MG PO TABS
400.0000 mg | ORAL_TABLET | Freq: Four times a day (QID) | ORAL | Status: DC | PRN
  Administered 2013-05-17 (×2): 400 mg via ORAL
  Filled 2013-05-17 (×3): qty 1

## 2013-05-17 NOTE — Progress Notes (Signed)
Pt earlier had a temp of 100.2 at 2230,tab tylenol 325mg  given as requested by pt,encouraged to use the incentive spirometer, temp rechecked at 0039,read 99.9, 1 tab of vicodin given at this time for pain. Temp rose up to 101.2 at 0146 with pt c/o of a lot of pain in his back,Dr  [facility] paged and notified,ordered to do a CXR,UA and CBC,IM Dilaudid 1.5mg  repeated for pain at 0259,will continue to monitor. Joshua Baldwin, Joshua Baldwin

## 2013-05-17 NOTE — Progress Notes (Signed)
Physical Therapy Treatment Patient Details Name: Joshua Baldwin MRN: [id number] DOB: [birth year] Today's Date: 05/17/2013 Time: 4098-1191 PT Time Calculation (min): 10 min  PT Assessment / Plan / Recommendation  History of Present Illness pt presents with L2-5 PLIF.     PT Comments   Pt with increased pain overnight and today limiting mobility today.  Pt states that he thinks he did too much yesterday when he walked all the way outside yesterday afternoon.  Discussed moderation with activity and decreasing lengths of walks, but increasing frequency.    Follow Up Recommendations  No PT follow up     Does the patient have the potential to tolerate intense rehabilitation     Barriers to Discharge        Equipment Recommendations  Rolling walker with 5" wheels    Recommendations for Other Services    Frequency Min 5X/week   Progress towards PT Goals Progress towards PT goals: Progressing toward goals  Plan Equipment recommendations need to be updated    Precautions / Restrictions Precautions Precautions: Back Precaution Comments: pt verbalized 3/3 back precautions.   Required Braces or Orthoses: Spinal Brace Spinal Brace: Lumbar corset;Applied in sitting position Restrictions Weight Bearing Restrictions: No   Pertinent Vitals/Pain 4-5/10.  Premedicated per pt.      Mobility  Bed Mobility Bed Mobility: Not assessed Transfers Transfers: Sit to Stand;Stand to Sit Sit to Stand: 5: Supervision;With upper extremity assist;From chair/3-in-1 Stand to Sit: 5: Supervision;With upper extremity assist;To chair/3-in-1 Details for Transfer Assistance: Demos good technique.   Ambulation/Gait Ambulation/Gait Assistance: 5: Supervision Ambulation Distance (Feet): 140 Feet Assistive device: Rolling walker Ambulation/Gait Assistance Details: pt indicates increased pain today and needed RW for support.   Gait Pattern: Step-through pattern;Decreased stride length Stairs: No Wheelchair  Mobility Wheelchair Mobility: No    Exercises     PT Diagnosis:    PT Problem List:   PT Treatment Interventions:     PT Goals (current goals can now be found in the care plan section) Acute Rehab PT Goals Patient Stated Goal: Home Time For Goal Achievement: 05/23/13 Potential to Achieve Goals: Good  Visit Information  Last PT Received On: 05/17/13 Assistance Needed: +1 History of Present Illness: pt presents with L2-5 PLIF.      Subjective Data  Patient Stated Goal: Home   Cognition  Cognition Arousal/Alertness: Awake/alert Behavior During Therapy: WFL for tasks assessed/performed Overall Cognitive Status: Within Functional Limits for tasks assessed    Balance  Balance Balance Assessed: Yes Static Standing Balance Static Standing - Balance Support: Bilateral upper extremity supported Static Standing - Level of Assistance: 6: Modified independent (Device/Increase time)  End of Session PT - End of Session Equipment Utilized During Treatment: Back brace Activity Tolerance: Patient tolerated treatment well Patient left: in chair;with call bell/phone within reach Nurse Communication: Mobility status   GP     Sunny Schlein, [place] [phone] 05/17/2013, 2:21 PM

## 2013-05-17 NOTE — Progress Notes (Signed)
Patient ID: Joshua Baldwin, male   DOB: 1961-04-23, 52 y.o.   MRN: [id number] t max 102. Now afebrile Wound fine. Dressing changed. No new neuro issues.bulated well.  Temp for unknown reason, quickly resolved. Will keep til tomorrow to make sure elevated temps do not continmue. Plan d/c tomorrow.

## 2013-05-17 NOTE — Progress Notes (Signed)
Pt c/o pain medication regimen. Refusing IM dilaudid, "my body is so sore from all those shots." Stated he should not be taking so much tylenol secondary to liver issues. Spoke with Dr. Wynetta Emery. He ordered oxy IR instead. Will continue to monitor.

## 2013-05-18 MED ORDER — OXYCODONE HCL 5 MG PO TABS: 5.0000 mg | ORAL_TABLET | ORAL | Status: DC | PRN

## 2013-05-18 MED ORDER — DOXYCYCLINE HYCLATE 50 MG PO CAPS: 100.0000 mg | ORAL_CAPSULE | Freq: Two times a day (BID) | ORAL | Status: DC

## 2013-05-18 MED ORDER — CYCLOBENZAPRINE HCL 10 MG PO TABS: 10.0000 mg | ORAL_TABLET | Freq: Three times a day (TID) | ORAL | Status: DC | PRN

## 2013-05-18 NOTE — Discharge Summary (Signed)
  Physician Discharge Summary  Patient ID: Joshua Baldwin MRN: [id number] DOB/AGE: 1960/11/09 52 y.o.  Admit date: 05/15/2013 Discharge date: 05/18/2013  Admission Diagnoses:  Discharge Diagnoses:  Active Problems:   * No active hospital problems. *   Discharged Condition: good  Hospital Course: Surgery 3 days ago for multi level disease with 3 level plif. Did extremely well. Steadily increaased activity. Few episodes of slightly elevated temp that resolved with incentive spiro. By pod 3, doing well. [place]. Pain well controlled. Home with specific instructions./  Consults: None  Significant Diagnostic Studies: none  Treatments: surgery: L 23 L 34 L 45 plif  Discharge Exam: Blood pressure 111/65, pulse 98, temperature 99.5 F (37.5 C), temperature source Oral, resp. rate 20, height 5' 6.14" (1.68 m), weight 69.8 kg (153 lb 14.1 oz), SpO2 100.00%. Incision/Wound:clean and dry; some fresh ateri strips applied; no new neuo issues  Disposition: 01-Home or Self Care     Medication List    ASK your doctor about these medications       HYDROcodone-acetaminophen 10-325 MG per tablet  Commonly known as:  NORCO  Take 1 tablet by mouth every 6 (six) hours as needed for pain.     multivitamin with minerals Tabs tablet  Take 1 tablet by mouth daily.         At home rest most of the time. Get up 9 or 10 times each day and take a 15 or 20 minute walk. No riding in the car and to your first postoperative appointment. If you have neck surgery you may shower from the chest down starting on the third postoperative day. If you had back surgery he may start showering on the third postoperative day with saran wrap wrapped around your incisional area 3 times. After the shower remove the saran wrap. Take pain medicine as needed and other medications as instructed. Call my office for an appointment.  SignedReinaldo Meeker, MD 05/18/2013, 11:53 AM

## 2013-05-18 NOTE — Progress Notes (Signed)
PT Cancellation Note  Patient Details Name: Freddi Schrager MRN: [id number] DOB: 11-10-1960   Cancelled Treatment:    Reason Eval/Treat Not Completed: Patient declined, no reason specified.  Pt indicates that he has already ambulated this am.  Pt notes he is just waiting for MD to D/C him.  Will f/u tomorrow if pt does not D/C.     Sunny Schlein, [place] [phone] 05/18/2013, 8:51 AM

## 2013-06-08 ENCOUNTER — Encounter (HOSPITAL_BASED_OUTPATIENT_CLINIC_OR_DEPARTMENT_OTHER): Payer: Medicaid Other

## 2014-03-12 ENCOUNTER — Other Ambulatory Visit: Payer: Self-pay | Admitting: Neurosurgery

## 2014-03-12 DIAGNOSIS — Q762 Congenital spondylolisthesis: Secondary | ICD-10-CM

## 2014-03-19 ENCOUNTER — Ambulatory Visit
Admission: RE | Admit: 2014-03-19 | Discharge: 2014-03-19 | Disposition: A | Discharge status: Home / Self Care | Payer: Medicaid Other | Source: Ambulatory Visit | Attending: Neurosurgery | Admitting: Neurosurgery

## 2014-03-19 DIAGNOSIS — Q762 Congenital spondylolisthesis: Secondary | ICD-10-CM

## 2014-03-19 MED ORDER — GADOBENATE DIMEGLUMINE 529 MG/ML IV SOLN
14.0000 mL | Freq: Once | INTRAVENOUS | Status: AC | PRN
  Administered 2014-03-19: 14 mL via INTRAVENOUS

## 2014-03-25 ENCOUNTER — Other Ambulatory Visit: Payer: Self-pay | Admitting: Neurosurgery

## 2014-03-25 DIAGNOSIS — M4310 Spondylolisthesis, site unspecified: Secondary | ICD-10-CM

## 2014-03-27 ENCOUNTER — Other Ambulatory Visit: Payer: Medicaid Other

## 2014-04-10 ENCOUNTER — Other Ambulatory Visit: Payer: Self-pay | Admitting: Neurosurgery

## 2014-04-10 ENCOUNTER — Ambulatory Visit
Admission: RE | Admit: 2014-04-10 | Discharge: 2014-04-10 | Disposition: A | Discharge status: Home / Self Care | Payer: Medicaid Other | Source: Ambulatory Visit | Attending: Neurosurgery | Admitting: Neurosurgery

## 2014-04-10 DIAGNOSIS — M4310 Spondylolisthesis, site unspecified: Secondary | ICD-10-CM

## 2014-04-10 MED ORDER — IOHEXOL 180 MG/ML  SOLN
1.0000 mL | Freq: Once | INTRAMUSCULAR | Status: AC | PRN
  Administered 2014-04-10: 1 mL via EPIDURAL

## 2014-04-10 MED ORDER — METHYLPREDNISOLONE ACETATE 40 MG/ML INJ SUSP (RADIOLOG
120.0000 mg | Freq: Once | INTRAMUSCULAR | Status: AC
  Administered 2014-04-10: 120 mg via EPIDURAL

## 2014-04-10 NOTE — Discharge Instructions (Signed)

## 2014-10-01 ENCOUNTER — Other Ambulatory Visit: Payer: Self-pay | Admitting: Neurosurgery

## 2014-10-01 DIAGNOSIS — M5136 Other intervertebral disc degeneration, lumbar region: Secondary | ICD-10-CM

## 2014-10-02 ENCOUNTER — Ambulatory Visit
Admission: RE | Admit: 2014-10-02 | Discharge: 2014-10-02 | Disposition: A | Discharge status: Home / Self Care | Payer: Medicaid Other | Source: Ambulatory Visit | Attending: Neurosurgery | Admitting: Neurosurgery

## 2014-10-02 ENCOUNTER — Other Ambulatory Visit: Payer: Self-pay | Admitting: Neurosurgery

## 2014-10-02 DIAGNOSIS — M5136 Other intervertebral disc degeneration, lumbar region: Secondary | ICD-10-CM

## 2014-10-02 MED ORDER — METHYLPREDNISOLONE ACETATE 40 MG/ML INJ SUSP (RADIOLOG
120.0000 mg | Freq: Once | INTRAMUSCULAR | Status: AC
  Administered 2014-10-02: 120 mg via EPIDURAL

## 2014-10-02 MED ORDER — IOHEXOL 180 MG/ML  SOLN
1.0000 mL | Freq: Once | INTRAMUSCULAR | Status: AC | PRN
  Administered 2014-10-02: 1 mL via EPIDURAL

## 2014-10-21 ENCOUNTER — Encounter (HOSPITAL_COMMUNITY): Payer: Self-pay | Admitting: Emergency Medicine

## 2014-10-21 ENCOUNTER — Emergency Department (HOSPITAL_COMMUNITY): Payer: Medicaid Other

## 2014-10-21 ENCOUNTER — Emergency Department (HOSPITAL_COMMUNITY)
Admission: EM | Admit: 2014-10-21 | Discharge: 2014-10-21 | Disposition: A | Discharge status: Home / Self Care | Payer: Medicaid Other | Attending: Emergency Medicine | Admitting: Emergency Medicine

## 2014-10-21 DIAGNOSIS — Z720 Tobacco use: Secondary | ICD-10-CM | POA: Diagnosis not present

## 2014-10-21 DIAGNOSIS — Z792 Long term (current) use of antibiotics: Secondary | ICD-10-CM | POA: Insufficient documentation

## 2014-10-21 DIAGNOSIS — Z79899 Other long term (current) drug therapy: Secondary | ICD-10-CM | POA: Diagnosis not present

## 2014-10-21 DIAGNOSIS — J069 Acute upper respiratory infection, unspecified: Secondary | ICD-10-CM | POA: Diagnosis not present

## 2014-10-21 DIAGNOSIS — R197 Diarrhea, unspecified: Secondary | ICD-10-CM | POA: Diagnosis present

## 2014-10-21 DIAGNOSIS — M1990 Unspecified osteoarthritis, unspecified site: Secondary | ICD-10-CM | POA: Insufficient documentation

## 2014-10-21 MED ORDER — HYDROCOD POLST-CHLORPHEN POLST 10-8 MG/5ML PO LQCR: 5.0000 mL | Freq: Two times a day (BID) | ORAL | Status: DC | PRN

## 2014-10-21 MED ORDER — PSEUDOEPHEDRINE HCL 60 MG PO TABS: 60.0000 mg | ORAL_TABLET | ORAL | Status: DC | PRN

## 2014-10-21 MED ORDER — ALBUTEROL SULFATE HFA 108 (90 BASE) MCG/ACT IN AERS: 1.0000 | INHALATION_SPRAY | Freq: Four times a day (QID) | RESPIRATORY_TRACT | Status: DC | PRN

## 2014-10-21 NOTE — ED Provider Notes (Signed)
CSN: [id number]     Arrival date & time 10/21/14  1048 History  This chart was scribed for non-physician practitioner, Joycie PeekBenjamin Advait Buice, PA-C working with Gilda Creasehristopher J Pollina, MD by Luisa DagoPriscilla Tutu, Medical Scribe. This patient was seen in room WTR5/WTR5 and the patient's care was started at 12:56 PM.  Chief Complaint  Patient presents with  . URI  . Diarrhea   The history is provided by the patient and medical records. No language interpreter was used.   HPI Comments: Joshua Baldwin is a 54 y.o. male who presents to the Emergency Department complaining of gradual onset persistent HA that started approximately 4 days ago. Pt is also complaining of associated sinus pressure, dry cough in the AM, diaphoresis for the past 4 days, generalized myalgias, subjective fever, and chest congestion. Current ED temperature is 98.2. He reports drinking therapeutics teas with some relief of his cough and taking theraflu with some relief. Pt admits to one sick contact last week with similar symptoms. He also reports diarrhea in the AM but that is baseline for him.  Denies CP, SOB, numbness, weakness, rash.  Smokes 4-5 cigs per day Past Medical History  Diagnosis Date  . Back pain   . 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 Arthritis    Past Surgical History  Procedure Laterality Date  . Back surgery  04   No family history on file. History  Substance Use Topics  . Smoking status: Current Some Day Smoker -- 0.25 packs/day for 7 years    Types: Cigarettes  . Smokeless tobacco: Never Used  . Alcohol Use: No    Review of Systems  Constitutional: Positive for fever and chills.  HENT: Positive for congestion, rhinorrhea and sore throat. Negative for ear pain, sinus pressure, trouble swallowing and voice change.   Eyes: Negative for discharge.  Respiratory: Positive for cough. Negative for shortness of breath, wheezing and stridor.   Cardiovascular:  Negative for chest pain.  Gastrointestinal: Positive for diarrhea (per baseline). Negative for abdominal pain.  Genitourinary: Negative.   All other systems reviewed and are negative.  Allergies  Gadolinium derivatives and Mrv  Home Medications   Prior to Admission medications   Medication Sig Start Date End Date Taking? Authorizing Provider  albuterol (PROVENTIL HFA;VENTOLIN HFA) 108 (90 BASE) MCG/ACT inhaler Inhale 1-2 puffs into the lungs every 6 (six) hours as needed for wheezing or shortness of breath. 10/21/14   Joycie PeekBenjamin Piedad Standiford, PA-C  chlorpheniramine-HYDROcodone (TUSSIONEX PENNKINETIC ER) 10-8 MG/5ML LQCR Take 5 mLs by mouth every 12 (twelve) hours as needed for cough (Cough). 10/21/14   Joycie PeekBenjamin Kambrea Carrasco, PA-C  cyclobenzaprine (FLEXERIL) 10 MG tablet Take 1 tablet (10 mg total) by mouth 3 (three) times daily as needed for muscle spasms. 05/18/13   Aliene Beamsandy Kritzer, MD  doxycycline (VIBRAMYCIN) 50 MG capsule Take 2 capsules (100 mg total) by mouth 2 (two) times daily. 05/18/13   Aliene Beamsandy Kritzer, MD  ibuprofen (ADVIL,MOTRIN) 200 MG tablet Take 200 mg by mouth.    Historical Provider, MD  Multiple Vitamin (MULTIVITAMIN WITH MINERALS) TABS tablet Take 1 tablet by mouth daily.    Historical Provider, MD  oxyCODONE (ROXICODONE) 15 MG immediate release tablet Take 15 mg by mouth.    Historical Provider, MD  pseudoephedrine (SUDAFED) 60 MG tablet Take 1 tablet (60 mg total) by mouth every 4 (four) hours as needed for congestion. 10/21/14   Joycie PeekBenjamin Yamaris Cummings, PA-C   BP  110/77 mmHg  Pulse 80  Temp(Src) 98.2 F (36.8 C) (Oral)  Resp 18  SpO2 100%  Physical Exam  Constitutional: He is oriented to person, place, and time. He appears well-developed and well-nourished. No distress.  HENT:  Head: Normocephalic and atraumatic.  Right Ear: Tympanic membrane and external ear normal.  Left Ear: Tympanic membrane and external ear normal.  Nose: Nose normal.  Mouth/Throat: Oropharynx is clear and moist  and mucous membranes are normal. No oropharyngeal exudate.  Eyes: Conjunctivae and EOM are normal. Pupils are equal, round, and reactive to light.  Neck: Normal range of motion. Neck supple. No thyromegaly present.  Cardiovascular: Normal rate and regular rhythm.  Exam reveals no gallop and no friction rub.   No murmur heard. Pulmonary/Chest: Effort normal. No respiratory distress.  Abdominal: Soft. He exhibits no distension. There is no tenderness.  Musculoskeletal: Normal range of motion.  Lymphadenopathy:    He has no cervical adenopathy.  Neurological: He is alert and oriented to person, place, and time.  Skin: Skin is warm and dry.  Psychiatric: He has a normal mood and affect. His behavior is normal.  Nursing note and vitals reviewed.   ED Course  Procedures (including critical care time)  DIAGNOSTIC STUDIES: Oxygen Saturation is 100% on RA, normal by my interpretation.    COORDINATION OF CARE: 1:05 PM- Pt advised of plan for treatment and pt agrees.  Imaging Review Dg Chest 2 View  10/21/2014   CLINICAL DATA:  Cough and congestion. Shortness of breath. Diarrhea.  EXAM: CHEST  2 VIEW  COMPARISON:  05/17/2013  FINDINGS: Mild hyperinflation. There is no edema, consolidation, effusion, or pneumothorax. No cardiomegaly or change in mild aortic tortuosity. Negative hila.  Partially visualized lumbar posterior fixation.  IMPRESSION: No active cardiopulmonary disease.   Electronically Signed   By: Marnee Spring M.D.   On: 10/21/2014 12:12   Meds given in ED:  Medications - No data to display  Discharge Medication List as of 10/21/2014  1:19 PM    START taking these medications   Details  albuterol (PROVENTIL HFA;VENTOLIN HFA) 108 (90 BASE) MCG/ACT inhaler Inhale 1-2 puffs into the lungs every 6 (six) hours as needed for wheezing or shortness of breath., Starting 10/21/2014, Until Discontinued, Print    chlorpheniramine-HYDROcodone (TUSSIONEX PENNKINETIC ER) 10-8 MG/5ML LQCR Take  5 mLs by mouth every 12 (twelve) hours as needed for cough (Cough)., Starting 10/21/2014, Until Discontinued, Print    pseudoephedrine (SUDAFED) 60 MG tablet Take 1 tablet (60 mg total) by mouth every 4 (four) hours as needed for congestion., Starting 10/21/2014, Until Discontinued, Print       Filed Vitals:   10/21/14 1113  BP: 110/77  Pulse: 80  Temp: 98.2 F (36.8 C)  TempSrc: Oral  Resp: 18  SpO2: 100%    MDM  Vitals stable - WNL -afebrile Pt resting comfortably in ED. Appears well. PE--Benign lung exam. No evidence of dehydration. Imaging- CXR shows no acute cardiopulmonary pathology  DDX--likely viral URI. Will trxt symptoms with pain medicines, cough medicines, bronchodilators and decongestants. Discussed smoking cessation.  I discussed all relevant lab findings and imaging results with pt and they verbalized understanding. Discussed f/u with PCP within 48 hrs and return precautions, pt very amenable to plan.  Final diagnoses:  URI (upper respiratory infection)    I personally performed the services described in this documentation, which was scribed in my presence. The recorded information has been reviewed and is accurate.    Joycie Peek,  PA-C 10/21/14 2156  Gilda Crease, MD 10/22/14 [phone]

## 2014-10-21 NOTE — ED Notes (Signed)
Per pt, states cold symptoms and diarrhea since last Thursday-grandson was sick and now he and his wife is sick

## 2014-10-21 NOTE — Discharge Instructions (Signed)
Upper Respiratory Infection, Adult An upper respiratory infection (URI) is also sometimes known as the common cold. The upper respiratory tract includes the nose, sinuses, throat, trachea, and bronchi. Bronchi are the airways leading to the lungs. Most people improve within 1 week, but symptoms can last up to 2 weeks. A residual cough may last even longer.  CAUSES Many different viruses can infect the tissues lining the upper respiratory tract. The tissues become irritated and inflamed and often become very moist. Mucus production is also common. A cold is contagious. You can easily spread the virus to others by oral contact. This includes kissing, sharing a glass, coughing, or sneezing. Touching your mouth or nose and then touching a surface, which is then touched by another person, can also spread the virus. SYMPTOMS  Symptoms typically develop 1 to 3 days after you come in contact with a cold virus. Symptoms vary from person to person. They may include:  Runny nose.  Sneezing.  Nasal congestion.  Sinus irritation.  Sore throat.  Loss of voice (laryngitis).  Cough.  Fatigue.  Muscle aches.  Loss of appetite.  Headache.  Low-grade fever. DIAGNOSIS  You might diagnose your own cold based on familiar symptoms, since most people get a cold 2 to 3 times a year. Your caregiver can confirm this based on your exam. Most importantly, your caregiver can check that your symptoms are not due to another disease such as strep throat, sinusitis, pneumonia, asthma, or epiglottitis. Blood tests, throat tests, and X-rays are not necessary to diagnose a common cold, but they may sometimes be helpful in excluding other more serious diseases. Your caregiver will decide if any further tests are required. RISKS AND COMPLICATIONS  You may be at risk for a more severe case of the common cold if you smoke cigarettes, have chronic heart disease (such as heart failure) or lung disease (such as asthma), or if  you have a weakened immune system. The very young and very old are also at risk for more serious infections. Bacterial sinusitis, middle ear infections, and bacterial pneumonia can complicate the common cold. The common cold can worsen asthma and chronic obstructive pulmonary disease (COPD). Sometimes, these complications can require emergency medical care and may be life-threatening. PREVENTION  The best way to protect against getting a cold is to practice good hygiene. Avoid oral or hand contact with people with cold symptoms. Wash your hands often if contact occurs. There is no clear evidence that vitamin C, vitamin E, echinacea, or exercise reduces the chance of developing a cold. However, it is always recommended to get plenty of rest and practice good nutrition. TREATMENT  Treatment is directed at relieving symptoms. There is no cure. Antibiotics are not effective, because the infection is caused by a virus, not by bacteria. Treatment may include:  Increased fluid intake. Sports drinks offer valuable electrolytes, sugars, and fluids.  Breathing heated mist or steam (vaporizer or shower).  Eating chicken soup or other clear broths, and maintaining good nutrition.  Getting plenty of rest.  Using gargles or lozenges for comfort.  Controlling fevers with ibuprofen or acetaminophen as directed by your caregiver.  Increasing usage of your inhaler if you have asthma. Zinc gel and zinc lozenges, taken in the first 24 hours of the common cold, can shorten the duration and lessen the severity of symptoms. Pain medicines may help with fever, muscle aches, and throat pain. A variety of non-prescription medicines are available to treat congestion and runny nose. Your caregiver   can make recommendations and may suggest nasal or lung inhalers for other symptoms.  HOME CARE INSTRUCTIONS   Only take over-the-counter or prescription medicines for pain, discomfort, or fever as directed by your  caregiver.  Use a warm mist humidifier or inhale steam from a shower to increase air moisture. This may keep secretions moist and make it easier to breathe.  Drink enough water and fluids to keep your urine clear or pale yellow.  Rest as needed.  Return to work when your temperature has returned to normal or as your caregiver advises. You may need to stay home longer to avoid infecting others. You can also use a face mask and careful hand washing to prevent spread of the virus. SEEK MEDICAL CARE IF:   After the first few days, you feel you are getting worse rather than better.  You need your caregiver's advice about medicines to control symptoms.  You develop chills, worsening shortness of breath, or brown or red sputum. These may be signs of pneumonia.  You develop yellow or brown nasal discharge or pain in the face, especially when you bend forward. These may be signs of sinusitis.  You develop a fever, swollen neck glands, pain with swallowing, or white areas in the back of your throat. These may be signs of strep throat. SEEK IMMEDIATE MEDICAL CARE IF:   You have a fever.  You develop severe or persistent headache, ear pain, sinus pain, or chest pain.  You develop wheezing, a prolonged cough, cough up blood, or have a change in your usual mucus (if you have chronic lung disease).  You develop sore muscles or a stiff neck. Document Released: 01/19/2001 Document Revised: 10/18/2011 Document Reviewed: 10/31/2013 [employer] Patient Information 2015 [employer]. This information is not intended to replace advice given to you by your health care provider. Make sure you discuss any questions you have with your health care provider.  

## 2015-01-02 ENCOUNTER — Other Ambulatory Visit: Payer: Self-pay | Admitting: Neurosurgery

## 2015-01-02 DIAGNOSIS — M5136 Other intervertebral disc degeneration, lumbar region: Secondary | ICD-10-CM

## 2015-01-03 ENCOUNTER — Ambulatory Visit
Admission: RE | Admit: 2015-01-03 | Discharge: 2015-01-03 | Disposition: A | Discharge status: Home / Self Care | Payer: Medicaid Other | Source: Ambulatory Visit | Attending: Neurosurgery | Admitting: Neurosurgery

## 2015-01-03 ENCOUNTER — Other Ambulatory Visit: Payer: Self-pay | Admitting: Neurosurgery

## 2015-01-03 DIAGNOSIS — M5136 Other intervertebral disc degeneration, lumbar region: Secondary | ICD-10-CM

## 2015-01-03 DIAGNOSIS — M51369 Other intervertebral disc degeneration, lumbar region without mention of lumbar back pain or lower extremity pain: Secondary | ICD-10-CM

## 2015-01-03 MED ORDER — IOHEXOL 180 MG/ML  SOLN
1.0000 mL | Freq: Once | INTRAMUSCULAR | Status: AC | PRN
Start: 2015-01-03 — End: 2015-01-03
  Administered 2015-01-03: 1 mL via EPIDURAL

## 2015-01-03 MED ORDER — METHYLPREDNISOLONE ACETATE 40 MG/ML INJ SUSP (RADIOLOG
120.0000 mg | Freq: Once | INTRAMUSCULAR | Status: AC
  Administered 2015-01-03: 120 mg via EPIDURAL

## 2015-11-02 ENCOUNTER — Encounter (HOSPITAL_COMMUNITY): Payer: Self-pay

## 2015-11-02 ENCOUNTER — Emergency Department (HOSPITAL_COMMUNITY): Payer: Medicaid Other

## 2015-11-02 ENCOUNTER — Emergency Department (HOSPITAL_COMMUNITY)
Admission: EM | Admit: 2015-11-02 | Discharge: 2015-11-02 | Disposition: A | Discharge status: Home / Self Care | Payer: Medicaid Other | Attending: Emergency Medicine | Admitting: Emergency Medicine

## 2015-11-02 DIAGNOSIS — M1990 Unspecified osteoarthritis, unspecified site: Secondary | ICD-10-CM | POA: Diagnosis not present

## 2015-11-02 DIAGNOSIS — R1013 Epigastric pain: Secondary | ICD-10-CM | POA: Diagnosis not present

## 2015-11-02 DIAGNOSIS — Z79899 Other long term (current) drug therapy: Secondary | ICD-10-CM | POA: Insufficient documentation

## 2015-11-02 DIAGNOSIS — F17210 Nicotine dependence, cigarettes, uncomplicated: Secondary | ICD-10-CM | POA: Diagnosis not present

## 2015-11-02 DIAGNOSIS — R112 Nausea with vomiting, unspecified: Secondary | ICD-10-CM | POA: Insufficient documentation

## 2015-11-02 DIAGNOSIS — R918 Other nonspecific abnormal finding of lung field: Secondary | ICD-10-CM | POA: Insufficient documentation

## 2015-11-02 DIAGNOSIS — Z792 Long term (current) use of antibiotics: Secondary | ICD-10-CM | POA: Diagnosis not present

## 2015-11-02 DIAGNOSIS — R079 Chest pain, unspecified: Secondary | ICD-10-CM | POA: Insufficient documentation

## 2015-11-02 LAB — COMPREHENSIVE METABOLIC PANEL
ALK PHOS: 72 U/L (ref 38–126)
ALT: 46 U/L (ref 17–63)
AST: 41 U/L (ref 15–41)
Albumin: 3.9 g/dL (ref 3.5–5.0)
Anion gap: 10 (ref 5–15)
BUN: 11 mg/dL (ref 6–20)
CALCIUM: 9.7 mg/dL (ref 8.9–10.3)
CHLORIDE: 106 mmol/L (ref 101–111)
CO2: 27 mmol/L (ref 22–32)
Creatinine, Ser: 0.92 mg/dL (ref 0.61–1.24)
GFR calc Af Amer: >60 mL/min (ref >60)
GFR calc non Af Amer: >60 mL/min (ref >60)
GLUCOSE: 126 mg/dL — AB (ref 65–99)
Potassium: 3.7 mmol/L (ref 3.5–5.1)
Sodium: 143 mmol/L (ref 135–145)
Total Bilirubin: 0.8 mg/dL (ref 0.3–1.2)
Total Protein: 8 g/dL (ref 6.5–8.1)

## 2015-11-02 LAB — URINE MICROSCOPIC-ADD ON
RBC / HPF: NONE SEEN RBC/hpf (ref 0–5)
WBC, UA: NONE SEEN WBC/hpf (ref 0–5)

## 2015-11-02 LAB — URINALYSIS, ROUTINE W REFLEX MICROSCOPIC
Bilirubin Urine: NEGATIVE
Glucose, UA: NEGATIVE mg/dL
Hgb urine dipstick: NEGATIVE
KETONES UR: NEGATIVE mg/dL
LEUKOCYTES UA: NEGATIVE
Nitrite: NEGATIVE
PH: 7.5 (ref 5.0–8.0)
Protein, ur: 30 mg/dL — AB
SPECIFIC GRAVITY, URINE: 1.025 (ref 1.005–1.030)

## 2015-11-02 LAB — CBC
HCT: 39.1 % (ref 39.0–52.0)
HEMOGLOBIN: 13.8 g/dL (ref 13.0–17.0)
MCH: 30.5 pg (ref 26.0–34.0)
MCHC: 35.3 g/dL (ref 30.0–36.0)
MCV: 86.5 fL (ref 78.0–100.0)
PLATELETS: 342 10*3/uL (ref 150–400)
RBC: 4.52 MIL/uL (ref 4.22–5.81)
RDW: 13 % (ref 11.5–15.5)
WBC: 9.2 10*3/uL (ref 4.0–10.5)

## 2015-11-02 LAB — LIPASE, BLOOD: Lipase: 22 U/L (ref 11–51)

## 2015-11-02 LAB — TROPONIN I

## 2015-11-02 LAB — I-STAT TROPONIN, ED: Troponin i, poc: 0 ng/mL (ref 0.00–0.08)

## 2015-11-02 MED ORDER — PANTOPRAZOLE SODIUM 40 MG IV SOLR
40.0000 mg | Freq: Once | INTRAVENOUS | Status: AC
  Administered 2015-11-02: 40 mg via INTRAVENOUS
  Filled 2015-11-02: qty 40

## 2015-11-02 MED ORDER — AZITHROMYCIN 250 MG PO TABS: ORAL_TABLET | ORAL | Status: DC

## 2015-11-02 MED ORDER — ONDANSETRON HCL 4 MG/2ML IJ SOLN
4.0000 mg | Freq: Once | INTRAMUSCULAR | Status: AC
  Administered 2015-11-02: 4 mg via INTRAVENOUS
  Filled 2015-11-02: qty 2

## 2015-11-02 MED ORDER — SODIUM CHLORIDE 0.9 % IV BOLUS (SEPSIS)
500.0000 mL | Freq: Once | INTRAVENOUS | Status: AC
  Administered 2015-11-02: 500 mL via INTRAVENOUS

## 2015-11-02 MED ORDER — SODIUM CHLORIDE 0.9 % IV BOLUS (SEPSIS)
1000.0000 mL | Freq: Once | INTRAVENOUS | Status: AC
  Administered 2015-11-02: 1000 mL via INTRAVENOUS

## 2015-11-02 MED ORDER — FAMOTIDINE 20 MG PO TABS
20.0000 mg | ORAL_TABLET | Freq: Once | ORAL | Status: AC
  Administered 2015-11-02: 20 mg via ORAL
  Filled 2015-11-02: qty 1

## 2015-11-02 MED ORDER — GI COCKTAIL ~~LOC~~
30.0000 mL | Freq: Once | ORAL | Status: AC
  Administered 2015-11-02: 30 mL via ORAL
  Filled 2015-11-02: qty 30

## 2015-11-02 NOTE — ED Notes (Signed)
Pt having episodes of n/v x1 small amt. Pt reported that it was due to the smell of IV flds. Will continue to monitor.

## 2015-11-02 NOTE — ED Notes (Signed)
Pt having episode of n/v x1 of approx 50ml of yellowish emesis. Will make MD aware.

## 2015-11-02 NOTE — ED Notes (Signed)
Pt given water without any report of n/v.

## 2015-11-02 NOTE — ED Notes (Signed)
He c/o n/v plus chest discomfort and shortness of breath since yesterday.  EKG performed at triage.  He is agitated, as if uncomfortable.  His skin is normal, warm and dry and he is very mildly short of breath and in no distress.

## 2015-11-02 NOTE — ED Notes (Signed)
Awake. Verbally responsive. A/O x4. Resp even and unlabored. No audible adventitious breath sounds noted. ABC's intact.  

## 2015-11-02 NOTE — Discharge Instructions (Signed)
It was our pleasure to provide your ER care today - we hope that you feel better.  Rest. Drink plenty of fluids.  Your chest xray was read as showing an 'infiltrate' possible pneumonia - take antibiotic as prescribed.  Follow up with primary care doctor in 1 months time for repeat chest xray.  For GI symptoms, you may try taking pepcid or zantac as need.  You may also try maalox as need.  Follow up with primary care doctor in the next 1-2 days for recheck if symptoms fail to improve/resolve.  Return to ER right away if worse, new symptoms, fevers, persistent vomiting, trouble breathing, severe abdominal pain, persistent or recurrent chest pain, other concern.      Nausea and Vomiting Nausea is a sick feeling that often comes before throwing up (vomiting). Vomiting is a reflex where stomach contents come out of your mouth. Vomiting can cause severe loss of body fluids (dehydration). Children and elderly adults can become dehydrated quickly, especially if they also have diarrhea. Nausea and vomiting are symptoms of a condition or disease. It is important to find the cause of your symptoms. CAUSES   Direct irritation of the stomach lining. This irritation can result from increased acid production (gastroesophageal reflux disease), infection, food poisoning, taking certain medicines (such as nonsteroidal anti-inflammatory drugs), alcohol use, or tobacco use.  Signals from the brain.These signals could be caused by a headache, heat exposure, an inner ear disturbance, increased pressure in the brain from injury, infection, a tumor, or a concussion, pain, emotional stimulus, or metabolic problems.  An obstruction in the gastrointestinal tract (bowel obstruction).  Illnesses such as diabetes, hepatitis, gallbladder problems, appendicitis, kidney problems, cancer, sepsis, atypical symptoms of a heart attack, or eating disorders.  Medical treatments such as chemotherapy and radiation.  Receiving  medicine that makes you sleep (general anesthetic) during surgery. DIAGNOSIS Your caregiver may ask for tests to be done if the problems do not improve after a few days. Tests may also be done if symptoms are severe or if the reason for the nausea and vomiting is not clear. Tests may include:  Urine tests.  Blood tests.  Stool tests.  Cultures (to look for evidence of infection).  X-rays or other imaging studies. Test results can help your caregiver make decisions about treatment or the need for additional tests. TREATMENT You need to stay well hydrated. Drink frequently but in small amounts.You may wish to drink water, sports drinks, clear broth, or eat frozen ice pops or gelatin dessert to help stay hydrated.When you eat, eating slowly may help prevent nausea.There are also some antinausea medicines that may help prevent nausea. HOME CARE INSTRUCTIONS   Take all medicine as directed by your caregiver.  If you do not have an appetite, do not force yourself to eat. However, you must continue to drink fluids.  If you have an appetite, eat a normal diet unless your caregiver tells you differently.  Eat a variety of complex carbohydrates (rice, wheat, potatoes, bread), lean meats, yogurt, fruits, and vegetables.  Avoid high-fat foods because they are more difficult to digest.  Drink enough water and fluids to keep your urine clear or pale yellow.  If you are dehydrated, ask your caregiver for specific rehydration instructions. Signs of dehydration may include:  Severe thirst.  Dry lips and mouth.  Dizziness.  Dark urine.  Decreasing urine frequency and amount.  Confusion.  Rapid breathing or pulse. SEEK IMMEDIATE MEDICAL CARE IF:   You have blood or  brown flecks (like coffee grounds) in your vomit.  You have black or bloody stools.  You have a severe headache or stiff neck.  You are confused.  You have severe abdominal pain.  You have chest pain or trouble  breathing.  You do not urinate at least once every 8 hours.  You develop cold or clammy skin.  You continue to vomit for longer than 24 to 48 hours.  You have a fever. MAKE SURE YOU:   Understand these instructions.  Will watch your condition.  Will get help right away if you are not doing well or get worse.   This information is not intended to replace advice given to you by your health care provider. Make sure you discuss any questions you have with your health care provider.   Document Released: 07/26/2005 Document Revised: 10/18/2011 Document Reviewed: 12/23/2010 Elsevier Interactive Patient Education 2016 [employer] Inc[employer]   Gastritis, Adult Gastritis is soreness and swelling (inflammation) of the lining of the stomach. Gastritis can develop as a sudden onset (acute) or long-term (chronic) condition. If gastritis is not treated, it can lead to stomach bleeding and ulcers. CAUSES  Gastritis occurs when the stomach lining is weak or damaged. Digestive juices from the stomach then inflame the weakened stomach lining. The stomach lining may be weak or damaged due to viral or bacterial infections. One common bacterial infection is the Helicobacter pylori infection. Gastritis can also result from excessive alcohol consumption, taking certain medicines, or having too much acid in the stomach.  SYMPTOMS  In some cases, there are no symptoms. When symptoms are present, they may include:  Pain or a burning sensation in the upper abdomen.  Nausea.  Vomiting.  An uncomfortable feeling of fullness after eating. DIAGNOSIS  Your caregiver may suspect you have gastritis based on your symptoms and a physical exam. To determine the cause of your gastritis, your caregiver may perform the following:  Blood or stool tests to check for the H pylori bacterium.  Gastroscopy. A thin, flexible tube (endoscope) is passed down the esophagus and into the stomach. The endoscope has a light and camera  on the end. Your caregiver uses the endoscope to view the inside of the stomach.  Taking a tissue sample (biopsy) from the stomach to examine under a microscope. TREATMENT  Depending on the cause of your gastritis, medicines may be prescribed. If you have a bacterial infection, such as an H pylori infection, antibiotics may be given. If your gastritis is caused by too much acid in the stomach, H2 blockers or antacids may be given. Your caregiver may recommend that you stop taking aspirin, ibuprofen, or other nonsteroidal anti-inflammatory drugs (NSAIDs). HOME CARE INSTRUCTIONS  Only take over-the-counter or prescription medicines as directed by your caregiver.  If you were given antibiotic medicines, take them as directed. Finish them even if you start to feel better.  Drink enough fluids to keep your urine clear or pale yellow.  Avoid foods and drinks that make your symptoms worse, such as:  Caffeine or alcoholic drinks.  Chocolate.  Peppermint or mint flavorings.  Garlic and onions.  Spicy foods.  Citrus fruits, such as oranges, lemons, or limes.  Tomato-based foods such as sauce, chili, salsa, and pizza.  Fried and fatty foods.  Eat small, frequent meals instead of large meals. SEEK IMMEDIATE MEDICAL CARE IF:   You have black or dark red stools.  You vomit blood or material that looks like coffee grounds.  You are unable to  keep fluids down.  Your abdominal pain gets worse.  You have a fever.  You do not feel better after 1 week.  You have any other questions or concerns. MAKE SURE YOU:  Understand these instructions.  Will watch your condition.  Will get help right away if you are not doing well or get worse.   This information is not intended to replace advice given to you by your health care provider. Make sure you discuss any questions you have with your health care provider.   Document Released: 07/20/2001 Document Revised: 01/25/2012 Document  Reviewed: 09/08/2011 Elsevier Interactive Patient Education 2016 [employer] Inc[employer]    Esophagitis Esophagitis is inflammation of the esophagus. The esophagus is the tube that carries food and liquids from your mouth to your stomach. Esophagitis can cause soreness or pain in the esophagus. This condition can make it difficult and painful to swallow.  CAUSES Most causes of esophagitis are not serious. Common causes of this condition include:  Gastroesophageal reflux disease (GERD). This is when stomach contents move back up into the esophagus (reflux).  Repeated vomiting.  An allergic-type reaction, especially caused by food allergies (eosinophilic esophagitis).  Injury to the esophagus by swallowing large pills with or without water, or swallowing certain types of medicines.  Swallowing (ingesting) harmful chemicals, such as household cleaning products.  Heavy alcohol use.  An infection of the esophagus.This most often occurs in people who have a weakened immune system.  Radiation or chemotherapy treatment for cancer.  Certain diseases such as sarcoidosis, Crohn disease, and scleroderma. SYMPTOMS Symptoms of this condition include:  Difficult or painful swallowing.  Pain with swallowing acidic liquids, such as citrus juices.  Pain with burping.  Chest pain.  Difficulty breathing.  Nausea.  Vomiting.  Pain in the abdomen.  Weight loss.  Ulcers in the mouth.  Patches of white material in the mouth (candidiasis).  Fever.  Coughing up blood or vomiting blood.  Stool that is black, tarry, or bright red. DIAGNOSIS Your health care provider will take a medical history and perform a physical exam. You may also have other tests, including:  An endoscopy to examine your stomach and esophagus with a small camera.  A test that measures the acidity level in your esophagus.  A test that measures how much pressure is on your esophagus.  A barium swallow or modified  barium swallow to show the shape, size, and functioning of your esophagus.  Allergy tests. TREATMENT Treatment for this condition depends on the cause of your esophagitis. In some cases, steroids or other medicines may be given to help relieve your symptoms or to treat the underlying cause of your condition. You may have to make some lifestyle changes, such as:  Avoiding alcohol.  Quitting smoking.  Changing your diet.  Exercising.  Changing your sleep habits and your sleep environment. HOME CARE INSTRUCTIONS Take these actions to decrease your discomfort and to help avoid complications. Diet  Follow a diet as recommended by your health care provider. This may involve avoiding foods and drinks such as:  Coffee and tea (with or without caffeine).  Drinks that contain alcohol.  Energy drinks and sports drinks.  Carbonated drinks or sodas.  Chocolate and cocoa.  Peppermint and mint flavorings.  Garlic and onions.  Horseradish.  Spicy and acidic foods, including peppers, chili powder, curry powder, vinegar, hot sauces, and barbecue sauce.  Citrus fruit juices and citrus fruits, such as oranges, lemons, and limes.  Tomato-based foods, such as red sauce,  chili, salsa, and pizza with red sauce.  Fried and fatty foods, such as donuts, french fries, potato chips, and high-fat dressings.  High-fat meats, such as hot dogs and fatty cuts of red and white meats, such as rib eye steak, sausage, ham, and bacon.  High-fat dairy items, such as whole milk, butter, and cream cheese.  Eat small, frequent meals instead of large meals.  Avoid drinking large amounts of liquid with your meals.  Avoid eating meals during the 2-3 hours before bedtime.  Avoid lying down right after you eat.  Do not exercise right after you eat.  Avoid foods and drinks that seem to make your symptoms worse. General Instructions  Pay attention to any changes in your symptoms.  Take over-the-counter  and prescription medicines only as told by your health care provider. Do not take aspirin, ibuprofen, or other NSAIDs unless your health care provider told you to do so.  If you have trouble taking pills, use a pill splitter to decrease the size of the pill. This will decrease the chance of the pill getting stuck or injuring your esophagus on the way down. Also, drink water after you take a pill.  Do not use any tobacco products, including cigarettes, chewing tobacco, and e-cigarettes. If you need help quitting, ask your health care provider.  Wear loose-fitting clothing. Do not wear anything tight around your waist that causes pressure on your abdomen.  Raise (elevate) the head of your bed about 6 inches (15 cm).  Try to reduce your stress, such as with yoga or meditation. If you need help reducing stress, ask your health care provider.  If you are overweight, reduce your weight to an amount that is healthy for you. Ask your health care provider for guidance about a safe weight loss goal.  Keep all follow-up visits as told by your health care provider. This is important. SEEK MEDICAL CARE IF:  You have new symptoms.  You have unexplained weight loss.  You have difficulty swallowing, or it hurts to swallow.  You have wheezing or a persistent cough.  Your symptoms do not improve with treatment.  You have frequent heartburn for more than two weeks. SEEK IMMEDIATE MEDICAL CARE IF:  You have severe pain in your arms, neck, jaw, teeth, or back.  You feel sweaty, dizzy, or light-headed.  You have chest pain or shortness of breath.  You vomit and your vomit looks like blood or coffee grounds.  Your stool is bloody or black.  You have a fever.  You cannot swallow, drink, or eat.   This information is not intended to replace advice given to you by your health care provider. Make sure you discuss any questions you have with your health care provider.   Document Released:  09/02/2004 Document Revised: 04/16/2015 Document Reviewed: 11/20/2014 Elsevier Interactive Patient Education [employer]

## 2015-11-02 NOTE — ED Provider Notes (Signed)
CSN: [id number]     Arrival date & time 11/02/15  9629 History   First MD Initiated Contact with Patient 11/02/15 0920     Chief Complaint  Patient presents with  . Emesis  . Chest Pain     (Consider location/radiation/quality/duration/timing/severity/associated sxs/prior Treatment) Patient is a 55 y.o. male presenting with vomiting and chest pain. The history is provided by the patient.  Emesis Associated symptoms: abdominal pain   Associated symptoms: no headaches and no sore throat   Chest Pain Associated symptoms: abdominal pain, nausea and vomiting   Associated symptoms: no back pain, no cough, no fever, no headache and no shortness of breath   Patient c/o nausea and vomiting onset early this AM. Notes several episodes. Pt indicates also w pain from epigastric area up into chest, pain moderate, constant, dull.  No back or flank pain. No diaphoresis or sob. No pleuritic pain. Denies cough, sore throat, or uri c/o. No fever or chills. Having normal bms, no diarrhea or constipation. Denies hx same symptoms/pain. No other recent exertional cp or discomfort. No unusual doe. Denies fam hx premature cad. +smoker. No known ill contacts, or bad food ingestion.     Past Medical History  Diagnosis Date  . Back pain   . 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 Arthritis    Past Surgical History  Procedure Laterality Date  . Back surgery  04   No family history on file. Social History  Substance Use Topics  . Smoking status: Current Some Day Smoker -- 0.25 packs/day for 7 years    Types: Cigarettes  . Smokeless tobacco: Never Used  . Alcohol Use: No    Review of Systems  Constitutional: Negative for fever.  HENT: Negative for sore throat.   Eyes: Negative for redness.  Respiratory: Negative for cough and shortness of breath.   Cardiovascular: Positive for chest pain.  Gastrointestinal: Positive for nausea, vomiting and abdominal  pain.  Genitourinary: Negative for dysuria and flank pain.  Musculoskeletal: Negative for back pain and neck pain.  Skin: Negative for rash.  Neurological: Negative for headaches.  Hematological: Does not bruise/bleed easily.  Psychiatric/Behavioral: Negative for confusion.      Allergies  Gadolinium derivatives and Mrv  Home Medications   Prior to Admission medications   Medication Sig Start Date End Date Taking? Authorizing Provider  albuterol (PROVENTIL HFA;VENTOLIN HFA) 108 (90 BASE) MCG/ACT inhaler Inhale 1-2 puffs into the lungs every 6 (six) hours as needed for wheezing or shortness of breath. 10/21/14   Joycie Peek, PA-C  chlorpheniramine-HYDROcodone (TUSSIONEX PENNKINETIC ER) 10-8 MG/5ML LQCR Take 5 mLs by mouth every 12 (twelve) hours as needed for cough (Cough). 10/21/14   Joycie Peek, PA-C  cyclobenzaprine (FLEXERIL) 10 MG tablet Take 1 tablet (10 mg total) by mouth 3 (three) times daily as needed for muscle spasms. 05/18/13   Aliene Beams, MD  doxycycline (VIBRAMYCIN) 50 MG capsule Take 2 capsules (100 mg total) by mouth 2 (two) times daily. 05/18/13   Aliene Beams, MD  HYDROcodone-acetaminophen [facility]) 10-325 MG per tablet  10/15/14   Historical Provider, MD  ibuprofen (ADVIL,MOTRIN) 200 MG tablet Take 200 mg by mouth.    Historical Provider, MD  Multiple Vitamin (MULTIVITAMIN WITH MINERALS) TABS tablet Take 1 tablet by mouth daily.    Historical Provider, MD  oxyCODONE (ROXICODONE) 15 MG immediate release tablet Take 15 mg by mouth.    Historical Provider,  MD  pseudoephedrine (SUDAFED) 60 MG tablet Take 1 tablet (60 mg total) by mouth every 4 (four) hours as needed for congestion. 10/21/14   Joycie PeekBenjamin Cartner, PA-C   Pulse 66  Temp(Src) 97.9 F (36.6 C) (Oral)  Resp 14  Ht 5\' 7"  (1.702 m)  Wt 60.328 kg  BMI 20.83 kg/m2  SpO2 100% Physical Exam  Constitutional: He is oriented to person, place, and time. He appears well-developed and well-nourished. No  distress.  HENT:  Mouth/Throat: Oropharynx is clear and moist.  Eyes: Conjunctivae are normal. No scleral icterus.  Neck: Neck supple. No tracheal deviation present.  Cardiovascular: Normal rate, regular rhythm, normal heart sounds and intact distal pulses.  Exam reveals no gallop and no friction rub.   No murmur heard. Pulmonary/Chest: Effort normal and breath sounds normal. No accessory muscle usage. No respiratory distress.  Abdominal: Soft. Bowel sounds are normal. He exhibits no distension and no mass. There is tenderness. There is no rebound and no guarding.  Epigastric tenderness, no rebound or guarding.   Genitourinary:  No cva tenderness  Musculoskeletal: Normal range of motion. He exhibits no edema or tenderness.  Neurological: He is alert and oriented to person, place, and time.  Skin: Skin is warm and dry. He is not diaphoretic.  Psychiatric: He has a normal mood and affect.  Nursing note and vitals reviewed.   ED Course  Procedures (including critical care time) Labs Review     I have personally reviewed and evaluated these images and lab results as part of my medical decision-making.   EKG Interpretation   Date/Time:  Sunday November 02 2015 09:17:21 EDT Ventricular Rate:  63 PR Interval:  126 QRS Duration: 102 QT Interval:  406 QTC Calculation: 416 R Axis:   88 Text Interpretation:  Sinus rhythm No significant change since last  tracing Confirmed by Ruffin Lada  MD, Eliab Closson ([id number]) on 11/02/2015 9:22:22 AM      MDM   Iv ns bolus. protonix iv. zofran iv. Gi cocktail.   Reviewed nursing notes and prior charts for additional history.   After symptom onset approx 14 hrs ago, trop neg x 2, and not increasing.  With meds in ED, resolution of symptoms.  Possible pna on cxr noted, no recent abx, no chills/sweats.  Will rx for possible pna, vs poss asp.   Note that patient has zero resp distress or increased wob. Pullse ox 99%.   Recheck abd soft no tenderness.  Pt  denies chest pain or discomfort of any sort.  Pt is tolerating po fluids well and currently appears stable for d/c.  rec close pcp f/u.  Return precautions provided.      Cathren LaineKevin Kordelia Severin, MD 11/02/15 1414

## 2016-04-20 ENCOUNTER — Inpatient Hospital Stay (HOSPITAL_COMMUNITY)
Admission: EM | Admit: 2016-04-20 | Discharge: 2016-04-24 | DRG: 684 | Disposition: A | Discharge status: Home / Self Care | Payer: Medicaid Other | Attending: Internal Medicine | Admitting: Internal Medicine

## 2016-04-20 ENCOUNTER — Encounter (HOSPITAL_COMMUNITY): Payer: Self-pay | Admitting: Emergency Medicine

## 2016-04-20 ENCOUNTER — Emergency Department (HOSPITAL_COMMUNITY): Payer: Medicaid Other

## 2016-04-20 DIAGNOSIS — K5732 Diverticulitis of large intestine without perforation or abscess without bleeding: Secondary | ICD-10-CM

## 2016-04-20 DIAGNOSIS — K7689 Other specified diseases of liver: Secondary | ICD-10-CM | POA: Diagnosis present

## 2016-04-20 DIAGNOSIS — Z87891 Personal history of nicotine dependence: Secondary | ICD-10-CM

## 2016-04-20 DIAGNOSIS — E875 Hyperkalemia: Secondary | ICD-10-CM | POA: Diagnosis present

## 2016-04-20 DIAGNOSIS — N179 Acute kidney failure, unspecified: Principal | ICD-10-CM | POA: Diagnosis present

## 2016-04-20 DIAGNOSIS — Z981 Arthrodesis status: Secondary | ICD-10-CM

## 2016-04-20 DIAGNOSIS — R112 Nausea with vomiting, unspecified: Secondary | ICD-10-CM | POA: Diagnosis present

## 2016-04-20 DIAGNOSIS — M545 Low back pain: Secondary | ICD-10-CM | POA: Diagnosis present

## 2016-04-20 DIAGNOSIS — R1032 Left lower quadrant pain: Secondary | ICD-10-CM

## 2016-04-20 DIAGNOSIS — K5790 Diverticulosis of intestine, part unspecified, without perforation or abscess without bleeding: Secondary | ICD-10-CM | POA: Diagnosis present

## 2016-04-20 DIAGNOSIS — E860 Dehydration: Secondary | ICD-10-CM | POA: Diagnosis present

## 2016-04-20 DIAGNOSIS — R634 Abnormal weight loss: Secondary | ICD-10-CM | POA: Diagnosis present

## 2016-04-20 DIAGNOSIS — E876 Hypokalemia: Secondary | ICD-10-CM | POA: Diagnosis present

## 2016-04-20 DIAGNOSIS — D72829 Elevated white blood cell count, unspecified: Secondary | ICD-10-CM | POA: Diagnosis present

## 2016-04-20 LAB — COMPREHENSIVE METABOLIC PANEL
ALBUMIN: 5 g/dL (ref 3.5–5.0)
ALK PHOS: 88 U/L (ref 38–126)
ALT: 40 U/L (ref 17–63)
ANION GAP: 17 — AB (ref 5–15)
AST: 43 U/L — ABNORMAL HIGH (ref 15–41)
BILIRUBIN TOTAL: 1.1 mg/dL (ref 0.3–1.2)
BUN: 25 mg/dL — ABNORMAL HIGH (ref 6–20)
CALCIUM: 10.2 mg/dL (ref 8.9–10.3)
CO2: 25 mmol/L (ref 22–32)
CREATININE: 2.07 mg/dL — AB (ref 0.61–1.24)
Chloride: 92 mmol/L — ABNORMAL LOW (ref 101–111)
GFR calc non Af Amer: 34 mL/min — ABNORMAL LOW (ref >60)
GFR, EST AFRICAN AMERICAN: 40 mL/min — AB (ref >60)
GLUCOSE: 119 mg/dL — AB (ref 65–99)
Potassium: 3.3 mmol/L — ABNORMAL LOW (ref 3.5–5.1)
SODIUM: 134 mmol/L — AB (ref 135–145)
TOTAL PROTEIN: 9.5 g/dL — AB (ref 6.5–8.1)

## 2016-04-20 LAB — CBC
HCT: 44.8 % (ref 39.0–52.0)
HEMOGLOBIN: 16.4 g/dL (ref 13.0–17.0)
MCH: 31.3 pg (ref 26.0–34.0)
MCHC: 36.6 g/dL — AB (ref 30.0–36.0)
MCV: 85.5 fL (ref 78.0–100.0)
PLATELETS: 345 10*3/uL (ref 150–400)
RBC: 5.24 MIL/uL (ref 4.22–5.81)
RDW: 12.9 % (ref 11.5–15.5)
WBC: 16.3 10*3/uL — ABNORMAL HIGH (ref 4.0–10.5)

## 2016-04-20 LAB — URINALYSIS, ROUTINE W REFLEX MICROSCOPIC
Glucose, UA: NEGATIVE mg/dL
HGB URINE DIPSTICK: NEGATIVE
KETONES UR: NEGATIVE mg/dL
NITRITE: NEGATIVE
PROTEIN: 100 mg/dL — AB
SPECIFIC GRAVITY, URINE: 1.027 (ref 1.005–1.030)
pH: 5 (ref 5.0–8.0)

## 2016-04-20 LAB — URINE MICROSCOPIC-ADD ON: RBC / HPF: NONE SEEN RBC/hpf (ref 0–5)

## 2016-04-20 LAB — LIPASE, BLOOD: Lipase: 21 U/L (ref 11–51)

## 2016-04-20 MED ORDER — ONDANSETRON HCL 4 MG/2ML IJ SOLN
4.0000 mg | Freq: Once | INTRAMUSCULAR | Status: AC
  Administered 2016-04-20: 4 mg via INTRAVENOUS
  Filled 2016-04-20: qty 2

## 2016-04-20 MED ORDER — SODIUM CHLORIDE 0.9 % IV BOLUS (SEPSIS)
1000.0000 mL | Freq: Once | INTRAVENOUS | Status: AC
  Administered 2016-04-20: 1000 mL via INTRAVENOUS

## 2016-04-20 NOTE — ED Notes (Signed)
Pt states he took zofran at home with no relief.

## 2016-04-20 NOTE — ED Notes (Signed)
Spoke with PA about continuing insertion of foley.  PA states he wanted to continue with insertion of foley due to the "inability to urinate."

## 2016-04-20 NOTE — ED Notes (Signed)
Patient transported to CT 

## 2016-04-20 NOTE — ED Provider Notes (Signed)
[facility]-EMERGENCY DEPT Provider Note   CSN: [id number] Arrival date & time: 04/20/16  1907  By signing my name below, I, Linna DarnerRussell Turner, attest that this documentation has been prepared under the direction and in the presence of Will Candace Ramus, PA-C. Electronically Signed: Linna Darnerussell Turner, Scribe. 04/20/2016. 9:09 PM.  History   Chief Complaint Chief Complaint  Patient presents with  . Abdominal Pain  . Emesis    The history is provided by the patient. No language interpreter was used.     HPI Comments: Joshua Baldwin is a 55 y.o. male who presents to the Emergency Department complaining of constant, worsening, nausea and vomiting beginning two days ago. He states these symptoms presented after eating 2 days ago and believes he may have food poisoning. Pt states he has been vomiting with any food or fluid intake since onset. He endorses associated generalized abdominal cramping, left lower back pain, and difficulty urinating. He reports he had a fever 2 days ago but cannot recall his temperature. Pt states he has not urinated in three days and has experienced difficulty urinating in the past. Pt reports he has vomited 6 times today and has tried Zofran with no relief of his nausea/vomiting. Pt denies urinary urgency, diarrhea, rectal pain, penile pain, testicular pain, or any other associated symptoms.  Past Medical History:  Diagnosis Date  . Arthritis   . Back pain   . 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 AKI (acute kidney injury) (HCC) 04/21/2016  . Acute renal failure (ARF) (HCC) 04/21/2016  . 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 BACK SURGERY  04       Home Medications    Prior to Admission medications   Medication Sig Start Date End Date Taking? Authorizing Provider    HYDROcodone-acetaminophen (NORCO/VICODIN) 5-325 MG tablet Take 1 tablet by mouth every 6 (six) hours as needed for moderate pain or severe pain.   Yes Historical Provider, MD  albuterol (PROVENTIL HFA;VENTOLIN HFA) 108 (90 BASE) MCG/ACT inhaler Inhale 1-2 puffs into the lungs every 6 (six) hours as needed for wheezing or shortness of breath. Patient not taking: Reported on 11/02/2015 10/21/14   Joycie PeekBenjamin Cartner, PA-C  azithromycin (ZITHROMAX Z-PAK) 250 MG tablet Take as directed Patient not taking: Reported on 04/20/2016 11/02/15   Cathren LaineKevin Steinl, MD  chlorpheniramine-HYDROcodone [facility](TUSSIONEX PENNKINETIC ER) 10-8 MG/5ML LQCR Take 5 mLs by mouth every 12 (twelve) hours as needed for cough (Cough). Patient not taking: Reported on 11/02/2015 10/21/14   Joycie PeekBenjamin Cartner, PA-C  cyclobenzaprine (FLEXERIL) 10 MG tablet Take 1 tablet (10 mg total) by mouth 3 (three) times daily as needed for muscle spasms. Patient not taking: Reported on 11/02/2015 05/18/13   Aliene Beamsandy Kritzer, MD  doxycycline (VIBRAMYCIN) 50 MG capsule Take 2 capsules (100 mg total) by mouth 2 (two) times daily. Patient not taking: Reported on 11/02/2015 05/18/13   Aliene Beamsandy Kritzer, MD  pseudoephedrine (SUDAFED) 60 MG tablet Take 1 tablet (60 mg total) by mouth every 4 (four) hours as needed for congestion. Patient not taking: Reported on 11/02/2015 10/21/14   Joycie PeekBenjamin Cartner, PA-C    Family History History reviewed. No pertinent family history.  Social History Social History  Substance Use Topics  . Smoking status: Former Smoker    Packs/day: 0.25    Years: 7.00  Types: Cigarettes  . Smokeless tobacco: Never Used  . Alcohol use No     Allergies   Gadolinium derivatives and Mrv [mixed respiratory bacterial vaccine]   Review of Systems Review of Systems  Constitutional: Positive for fever. Negative for chills.  HENT: Negative for congestion and sore throat.   Eyes: Negative for visual disturbance.  Respiratory: Negative for cough and  shortness of breath.   Cardiovascular: Negative for chest pain.  Gastrointestinal: Positive for abdominal pain, nausea and vomiting. Negative for abdominal distention, blood in stool, diarrhea and rectal pain.  Genitourinary: Positive for difficulty urinating and flank pain. Negative for dysuria, hematuria, penile pain, testicular pain and urgency.  Musculoskeletal: Positive for back pain (left flank). Negative for neck pain.  Skin: Negative for rash.  Neurological: Negative for headaches.    Physical Exam Updated Vital Signs BP 139/100 (BP Location: Right Arm)   Pulse 88   Temp 98 F (36.7 C) (Oral)   Resp 16   SpO2 99%   Physical Exam  Constitutional: He appears well-developed and well-nourished. No distress.  Nontoxic appearing. Patient appears uncomfortable.  HENT:  Head: Normocephalic and atraumatic.  Mouth/Throat: Oropharynx is clear and moist.  Eyes: Conjunctivae are normal. Pupils are equal, round, and reactive to light. Right eye exhibits no discharge. Left eye exhibits no discharge.  Neck: Neck supple.  Cardiovascular: Normal rate, regular rhythm, normal heart sounds and intact distal pulses.  Exam reveals no gallop and no friction rub.   No murmur heard. Pulmonary/Chest: Effort normal and breath sounds normal. No respiratory distress. He has no wheezes. He has no rales.  Abdominal: Soft. Bowel sounds are normal. He exhibits no distension and no mass. There is tenderness. There is guarding. There is no rebound.  Suprapubic tenderness to palpation. Left flank tenderness to palpation. Abdomen is soft and bowel sounds are present.  Genitourinary: Penis normal. No penile tenderness.  Genitourinary Comments: GU exam is unremarkable. No penile tenderness to palpation. Penis is circumcised. No penile discharge.  Musculoskeletal: He exhibits no edema.  Lymphadenopathy:    He has no cervical adenopathy.  Neurological: He is alert. Coordination normal.  Skin: Skin is warm and  dry. Capillary refill takes less than 2 seconds. No rash noted. He is not diaphoretic. No erythema. No pallor.  Psychiatric: He has a normal mood and affect. His behavior is normal.  Nursing note and vitals reviewed.   ED Treatments / Results  Labs (all labs ordered are listed, but only abnormal results are displayed) Labs Reviewed  COMPREHENSIVE METABOLIC PANEL - Abnormal; Notable for the following:       Result Value   Sodium 134 (*)    Potassium 3.3 (*)    Chloride 92 (*)    Glucose, Bld 119 (*)    BUN 25 (*)    Creatinine, Ser 2.07 (*)    Total Protein 9.5 (*)    AST 43 (*)    GFR calc non Af Amer 34 (*)    GFR calc Af Amer 40 (*)    Anion gap 17 (*)    All other components within normal limits  CBC - Abnormal; Notable for the following:    WBC 16.3 (*)    MCHC 36.6 (*)    All other components within normal limits  URINALYSIS, ROUTINE W REFLEX MICROSCOPIC (NOT AT [facility]) - Abnormal; Notable for the following:    Color, Urine ORANGE (*)    APPearance CLOUDY (*)    Bilirubin Urine SMALL (*)  Protein, ur 100 (*)    Leukocytes, UA SMALL (*)    All other components within normal limits  URINE MICROSCOPIC-ADD ON - Abnormal; Notable for the following:    Squamous Epithelial / LPF 0-5 (*)    Bacteria, UA FEW (*)    Casts GRANULAR CAST (*)    Crystals CA OXALATE CRYSTALS (*)    All other components within normal limits  URINE CULTURE  MRSA PCR SCREENING  LIPASE, BLOOD  COMPREHENSIVE METABOLIC PANEL  TSH  SEDIMENTATION RATE  C-REACTIVE PROTEIN  CREATININE, URINE, RANDOM  SODIUM, URINE, RANDOM  LACTIC ACID, PLASMA  LACTIC ACID, PLASMA    EKG  EKG Interpretation None       Radiology Ct Renal Stone Study  Result Date: 04/21/2016 CLINICAL DATA:  55 y/o M; constant worsening nausea and vomiting beginning 2 days ago, possibly related to food poisoning. Generalized abdominal cramping, left lower back pain, and difficulty urinating. EXAM: CT ABDOMEN AND PELVIS WITHOUT  CONTRAST TECHNIQUE: Multidetector CT imaging of the abdomen and pelvis was performed following the standard protocol without IV contrast. COMPARISON:  02/24/2013 CT of abdomen and pelvis. EEN fall off bed all FINDINGS: Lower chest: No acute abnormality. Hepatobiliary: Numerous cystic liver lesions are increased in size in comparison with the prior CT for example the lesion within segment 2 measures 48 mm, previously 43 mm and the cyst within the caudate extent exophytically into the mid abdomen. No biliary ductal dilatation. Normal gallbladder. Pancreas: Unremarkable. No pancreatic ductal dilatation or surrounding inflammatory changes. Spleen: Normal in size without focal abnormality. Adrenals/Urinary Tract: Adrenal glands are unremarkable. Kidneys are normal, without renal calculi, focal lesion, or hydronephrosis. Bladder is unremarkable. Foley catheter in situ. Stable pelvic phleboliths. Stomach/Bowel: Stomach is within normal limits. No evidence of bowel wall thickening, distention, or inflammatory changes. Appendix is not visualized. Vascular/Lymphatic: Minimal aortic atherosclerosis. No enlarged abdominal or pelvic lymph nodes. Reproductive: Prostate is unremarkable. Other: No abdominal wall hernia or abnormality. No abdominopelvic ascites. Musculoskeletal: Severe degenerative changes of the right hip joint with loss of joint space, periarticular osteophytes and fibrocystic degeneration. Mild degenerative changes of the left hip joint. L2 through L5 posterior instrumented fusion, laminectomy, and interbody fusion with severe degenerative changes. L1-2 adjacent segment disease is new from prior CT. IMPRESSION: 1. No acute finding as explanation of abdominal pain. No obstructive uropathy or urinary stone disease. 2. Numerous cystic lesions within the liver are again increased in size from prior CT suggesting a progressive disorder such as polycystic liver disease or Caroli disease. 3. L2 through L5 posterior  instrumented fusion. 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 Electronically Signed   By: Mitzi Hansen M.D.   On: 04/21/2016 00:02    Procedures Procedures (including critical care time)  DIAGNOSTIC STUDIES: Oxygen Saturation is 100% on RA, normal by my interpretation.    COORDINATION OF CARE: 9:09 PM Discussed treatment plan with pt at bedside and pt agreed to plan.  Medications Ordered in ED Medications  HYDROcodone-acetaminophen (NORCO/VICODIN) 5-325 MG per tablet 1 tablet (not administered)  heparin injection 5,000 Units (not administered)  0.9 %  sodium chloride infusion ( Intravenous New Bag/Given 04/21/16 0301)  ondansetron (ZOFRAN) tablet 4 mg (not administered)    Or  ondansetron (ZOFRAN) injection 4 mg (not administered)  potassium chloride 10 mEq in 100 mL IVPB (10 mEq Intravenous Given 04/21/16 0308)  piperacillin-tazobactam (ZOSYN) IVPB 3.375 g (not administered)  sodium chloride 0.9 % bolus 1,000 mL (0 mLs Intravenous Stopped 04/20/16 2330)  ondansetron (  ZOFRAN) injection 4 mg (4 mg Intravenous Given 04/20/16 2142)  sodium chloride 0.9 % bolus 1,000 mL (0 mLs Intravenous Stopped 04/21/16 0228)     Initial Impression / Assessment and Plan / ED Course  I have reviewed the triage vital signs and the nursing notes.  Pertinent labs & imaging results that were available during my care of the patient were reviewed by me and considered in my medical decision making (see chart for details).  Clinical Course   This is a 55 y.o. male who presents to the Emergency Department complaining of constant, worsening, nausea and vomiting beginning two days ago. He states these symptoms presented after eating 2 days ago and believes he may have food poisoning. Pt states he has been vomiting with any food or fluid intake since onset. He endorses associated generalized abdominal cramping, left lower back pain, and difficulty urinating. He reports he had a fever 2 days ago but cannot  recall his temperature. Pt states he has not urinated in three days and has experienced difficulty urinating in the past. He denies rectal pain.  On exam the patient is afebrile nontoxic appearing. His abdomen is soft and has suprapubic abdominal tenderness to palpation. GU exam is unremarkable. Foley cath ordered. Nursing staff reports the patient had 74 mL's of urine on bladder scan. Urinalysis shows small leukocytes and granular casts on microscopic and on. Lipase is within normal limits. CMP reveals an acute kidney injury with a creatinine of 2.07 which is an increase from his baseline of around 0.97. Anion gap of 17. CBC reveals a white count of 16,000. At recheck patient reports he is feeling better now that the Foley cath is in place. There is only a small amount of urine and his catheter bag. Will obtain CT renal stone study to rule out obstructing stone. CT renal stone study shows no obstructive uropathy or urinary stone. It does show numerous cystic lesions within the liver. Will admit to medicine for acute kidney injury. Patient received 2 L fluid bolus in the emergency department. Patient agrees with plan for admission. He reports he is feeling better at time of admission.  I consulted with Dr. Katrinka Blazing who accepted the patient for admission. Temporary orders were placed for med surg bed.   This patient was discussed with Dr. Freida Busman who agrees with assessment and plan.  I personally performed the services described in this documentation, which was scribed in my presence. The recorded information has been reviewed and is accurate.        Final Clinical Impressions(s) / ED Diagnoses   Final diagnoses:  Weight loss  AKI (acute kidney injury) (HCC)  Non-intractable vomiting with nausea, vomiting of unspecified type    New Prescriptions Current Discharge Medication List       Everlene Farrier, PA-C 04/21/16 0318    Lorre Nick, MD 04/24/16 [phone]

## 2016-04-20 NOTE — ED Notes (Signed)
Pt states he is unable to urinate at this time.

## 2016-04-20 NOTE — ED Triage Notes (Signed)
Pt states he thinks he has food poisoning because he has had vomiting since Sunday  Pt states he has been taking fluids but is unable to keep anything down   Pt is c/o cramping all over his body  Pt is guarding his abdomen  Pt states he has not had a bowel movement in 3 days

## 2016-04-21 ENCOUNTER — Observation Stay (HOSPITAL_COMMUNITY): Payer: Medicaid Other

## 2016-04-21 DIAGNOSIS — K7689 Other specified diseases of liver: Secondary | ICD-10-CM

## 2016-04-21 DIAGNOSIS — R1110 Vomiting, unspecified: Secondary | ICD-10-CM | POA: Diagnosis not present

## 2016-04-21 DIAGNOSIS — N179 Acute kidney failure, unspecified: Secondary | ICD-10-CM | POA: Diagnosis present

## 2016-04-21 DIAGNOSIS — E860 Dehydration: Secondary | ICD-10-CM | POA: Diagnosis present

## 2016-04-21 DIAGNOSIS — Z87891 Personal history of nicotine dependence: Secondary | ICD-10-CM | POA: Diagnosis not present

## 2016-04-21 DIAGNOSIS — K5790 Diverticulosis of intestine, part unspecified, without perforation or abscess without bleeding: Secondary | ICD-10-CM | POA: Diagnosis present

## 2016-04-21 DIAGNOSIS — M545 Low back pain: Secondary | ICD-10-CM | POA: Diagnosis present

## 2016-04-21 DIAGNOSIS — E876 Hypokalemia: Secondary | ICD-10-CM | POA: Diagnosis not present

## 2016-04-21 DIAGNOSIS — R634 Abnormal weight loss: Secondary | ICD-10-CM

## 2016-04-21 DIAGNOSIS — R112 Nausea with vomiting, unspecified: Secondary | ICD-10-CM | POA: Diagnosis present

## 2016-04-21 DIAGNOSIS — D72829 Elevated white blood cell count, unspecified: Secondary | ICD-10-CM | POA: Diagnosis not present

## 2016-04-21 DIAGNOSIS — E875 Hyperkalemia: Secondary | ICD-10-CM | POA: Diagnosis present

## 2016-04-21 DIAGNOSIS — K5732 Diverticulitis of large intestine without perforation or abscess without bleeding: Secondary | ICD-10-CM | POA: Diagnosis not present

## 2016-04-21 DIAGNOSIS — Z981 Arthrodesis status: Secondary | ICD-10-CM | POA: Diagnosis not present

## 2016-04-21 LAB — COMPREHENSIVE METABOLIC PANEL
ALBUMIN: 4 g/dL (ref 3.5–5.0)
ALK PHOS: 73 U/L (ref 38–126)
ALT: 35 U/L (ref 17–63)
ANION GAP: 10 (ref 5–15)
AST: 37 U/L (ref 15–41)
BUN: 27 mg/dL — ABNORMAL HIGH (ref 6–20)
CO2: 29 mmol/L (ref 22–32)
Calcium: 8.8 mg/dL — ABNORMAL LOW (ref 8.9–10.3)
Chloride: 99 mmol/L — ABNORMAL LOW (ref 101–111)
Creatinine, Ser: 1.66 mg/dL — ABNORMAL HIGH (ref 0.61–1.24)
GFR calc Af Amer: 52 mL/min — ABNORMAL LOW (ref >60)
GFR calc non Af Amer: 45 mL/min — ABNORMAL LOW (ref >60)
GLUCOSE: 112 mg/dL — AB (ref 65–99)
POTASSIUM: 3.2 mmol/L — AB (ref 3.5–5.1)
SODIUM: 138 mmol/L (ref 135–145)
Total Bilirubin: 1 mg/dL (ref 0.3–1.2)
Total Protein: 7.5 g/dL (ref 6.5–8.1)

## 2016-04-21 LAB — SODIUM, URINE, RANDOM: SODIUM UR: 23 mmol/L

## 2016-04-21 LAB — C-REACTIVE PROTEIN

## 2016-04-21 LAB — MRSA PCR SCREENING: MRSA by PCR: POSITIVE — AB

## 2016-04-21 LAB — CREATININE, URINE, RANDOM: CREATININE, URINE: 434.61 mg/dL

## 2016-04-21 LAB — TSH: TSH: 0.23 u[IU]/mL — ABNORMAL LOW (ref 0.350–4.500)

## 2016-04-21 LAB — CBC
HCT: 37.4 % — ABNORMAL LOW (ref 39.0–52.0)
HEMOGLOBIN: 13.4 g/dL (ref 13.0–17.0)
MCH: 31 pg (ref 26.0–34.0)
MCHC: 35.8 g/dL (ref 30.0–36.0)
MCV: 86.6 fL (ref 78.0–100.0)
Platelets: 287 10*3/uL (ref 150–400)
RBC: 4.32 MIL/uL (ref 4.22–5.81)
RDW: 12.8 % (ref 11.5–15.5)
WBC: 17.4 10*3/uL — ABNORMAL HIGH (ref 4.0–10.5)

## 2016-04-21 LAB — T4, FREE: Free T4: 1.16 ng/dL — ABNORMAL HIGH (ref 0.61–1.12)

## 2016-04-21 LAB — SEDIMENTATION RATE: Sed Rate: 19 mm/hr — ABNORMAL HIGH (ref 0–16)

## 2016-04-21 LAB — LACTIC ACID, PLASMA
LACTIC ACID, VENOUS: 1.4 mmol/L (ref 0.5–1.9)
LACTIC ACID, VENOUS: 1.6 mmol/L (ref 0.5–1.9)

## 2016-04-21 MED ORDER — HYDROCODONE-ACETAMINOPHEN 5-325 MG PO TABS
1.0000 | ORAL_TABLET | Freq: Four times a day (QID) | ORAL | Status: DC | PRN
  Administered 2016-04-21 – 2016-04-23 (×5): 1 via ORAL
  Filled 2016-04-21 (×5): qty 1

## 2016-04-21 MED ORDER — PIPERACILLIN-TAZOBACTAM 3.375 G IVPB 30 MIN
3.3750 g | Freq: Once | INTRAVENOUS | Status: AC
  Administered 2016-04-21: 3.375 g via INTRAVENOUS
  Filled 2016-04-21 (×2): qty 50

## 2016-04-21 MED ORDER — POTASSIUM CHLORIDE CRYS ER 20 MEQ PO TBCR
40.0000 meq | EXTENDED_RELEASE_TABLET | Freq: Once | ORAL | Status: AC
  Administered 2016-04-21: 40 meq via ORAL
  Filled 2016-04-21: qty 2

## 2016-04-21 MED ORDER — SODIUM CHLORIDE 0.9 % IV BOLUS (SEPSIS)
1000.0000 mL | Freq: Once | INTRAVENOUS | Status: AC
  Administered 2016-04-21: 1000 mL via INTRAVENOUS

## 2016-04-21 MED ORDER — ONDANSETRON HCL 4 MG/2ML IJ SOLN: 4.0000 mg | Freq: Four times a day (QID) | INTRAMUSCULAR | Status: DC | PRN

## 2016-04-21 MED ORDER — CHLORHEXIDINE GLUCONATE CLOTH 2 % EX PADS
6.0000 | MEDICATED_PAD | Freq: Every day | CUTANEOUS | Status: DC
  Administered 2016-04-21 – 2016-04-24 (×4): 6 via TOPICAL

## 2016-04-21 MED ORDER — POTASSIUM CHLORIDE 10 MEQ/100ML IV SOLN
10.0000 meq | INTRAVENOUS | Status: AC
  Administered 2016-04-21 (×2): 10 meq via INTRAVENOUS
  Filled 2016-04-21 (×3): qty 100

## 2016-04-21 MED ORDER — MUPIROCIN 2 % EX OINT
1.0000 "application " | TOPICAL_OINTMENT | Freq: Two times a day (BID) | CUTANEOUS | Status: DC
  Administered 2016-04-21 – 2016-04-24 (×7): 1 via NASAL
  Filled 2016-04-21 (×2): qty 22

## 2016-04-21 MED ORDER — SODIUM CHLORIDE 0.9 % IV SOLN
INTRAVENOUS | Status: DC
  Administered 2016-04-21 – 2016-04-22 (×4): via INTRAVENOUS
  Administered 2016-04-23: 1000 mL via INTRAVENOUS

## 2016-04-21 MED ORDER — ONDANSETRON HCL 4 MG/2ML IJ SOLN
4.0000 mg | Freq: Four times a day (QID) | INTRAMUSCULAR | Status: DC | PRN
  Administered 2016-04-21: 4 mg via INTRAVENOUS
  Filled 2016-04-21: qty 2

## 2016-04-21 MED ORDER — ONDANSETRON HCL 4 MG PO TABS: 4.0000 mg | ORAL_TABLET | Freq: Four times a day (QID) | ORAL | Status: DC | PRN

## 2016-04-21 MED ORDER — HEPARIN SODIUM (PORCINE) 5000 UNIT/ML IJ SOLN
5000.0000 [IU] | Freq: Three times a day (TID) | INTRAMUSCULAR | Status: DC
  Administered 2016-04-21 – 2016-04-23 (×7): 5000 [IU] via SUBCUTANEOUS
  Filled 2016-04-21 (×8): qty 1

## 2016-04-21 MED ORDER — PANTOPRAZOLE SODIUM 40 MG PO TBEC
40.0000 mg | DELAYED_RELEASE_TABLET | Freq: Two times a day (BID) | ORAL | Status: DC
  Administered 2016-04-21 – 2016-04-24 (×7): 40 mg via ORAL
  Filled 2016-04-21 (×7): qty 1

## 2016-04-21 MED ORDER — ONDANSETRON HCL 8 MG PO TABS
8.0000 mg | ORAL_TABLET | Freq: Four times a day (QID) | ORAL | Status: DC | PRN
Start: 2016-04-21 — End: 2016-04-24
  Administered 2016-04-21 – 2016-04-22 (×3): 8 mg via ORAL
  Filled 2016-04-21 (×3): qty 1

## 2016-04-21 NOTE — H&P (Signed)
History and Physical    Joshua Baldwin OLI:[id number] DOB: [birth year] DOA: 04/20/2016  Referring MD/NP/PA: Sula Rumple PA-C PCP: Tobi Bastos, MD  Patient coming from: Home  Chief Complaint: Thought I may have gotten food poisoning  HPI: Joshua Baldwin is a 55 y.o. male with medical history significant of back pain and s/p lumbar fusion; who presents with complaints of nausea and vomiting. Symptoms started 3 days ago within a couple hours of eating some beef stew that his girlfriend made. He reported having acute onset of generalized abdominal pain that was sharp and crampy. He notes that that needs to have been sitting out but he does not know for how long prior to evening it. Associated symptoms included subjective fever, chills, diaphoresis, nausea, and vomiting. Patient states that he's not had a bowel movement or urinated since symptoms started. He tried Zofran without relief of symptoms. Denies any cough, chest pain, shortness of breath, dysuria, focal weakness, or rash. Over the last 6 months patient notes that his loss approximately 20 pounds. He denies any current alcohol, drug or tobacco use.   ED Course:  plan admission to [facility] patient was evaluated and seen to be afebrile with initial heart rate 110, all other vitals within normal limits. Following placement of Foley catheter about 80 mL of urine were obtained. Initial lab work revealed WBC 16.3, potassium 3.3, creatinine 2.07, and BUN 25.  Review of Systems: As per HPI otherwise 10 point review of systems negative.   Past Medical History:  Diagnosis Date  . Arthritis   . Back pain   . Lung abnormality    ?cyst of lung recently seen on back film told by dr Hal Neer    Past Surgical History:  Procedure Laterality Date  . BACK SURGERY  04     reports that he has quit smoking. His smoking use included Cigarettes. He has a 1.75 pack-year smoking history. He has never used smokeless tobacco. He reports that he  does not drink alcohol or use drugs.  Allergies  Allergen Reactions  . Gadolinium Derivatives Shortness Of Breath and Nausea And Vomiting  . Mrv [Mixed Respiratory Bacterial Vaccine]     History reviewed. No pertinent family history.  Prior to Admission medications   Medication Sig Start Date End Date Taking? Authorizing Provider  HYDROcodone-acetaminophen (NORCO/VICODIN) 5-325 MG tablet Take 1 tablet by mouth every 6 (six) hours as needed for moderate pain or severe pain.   Yes Historical Provider, MD  albuterol (PROVENTIL HFA;VENTOLIN HFA) 108 (90 BASE) MCG/ACT inhaler Inhale 1-2 puffs into the lungs every 6 (six) hours as needed for wheezing or shortness of breath. Patient not taking: Reported on 11/02/2015 10/21/14   Comer Locket, PA-C  azithromycin (ZITHROMAX Z-PAK) 250 MG tablet Take as directed Patient not taking: Reported on 04/20/2016 11/02/15   Lajean Saver, MD  chlorpheniramine-HYDROcodone [facility] PENNKINETIC ER) 10-8 MG/5ML LQCR Take 5 mLs by mouth every 12 (twelve) hours as needed for cough (Cough). Patient not taking: Reported on 11/02/2015 10/21/14   Comer Locket, PA-C  cyclobenzaprine (FLEXERIL) 10 MG tablet Take 1 tablet (10 mg total) by mouth 3 (three) times daily as needed for muscle spasms. Patient not taking: Reported on 11/02/2015 05/18/13   Karie Chimera, MD  doxycycline (VIBRAMYCIN) 50 MG capsule Take 2 capsules (100 mg total) by mouth 2 (two) times daily. Patient not taking: Reported on 11/02/2015 05/18/13   Karie Chimera, MD  pseudoephedrine (SUDAFED) 60 MG tablet Take 1 tablet (60 mg total) by mouth every  4 (four) hours as needed for congestion. Patient not taking: Reported on 11/02/2015 10/21/14   Comer Locket, PA-C    Physical Exam:   Constitutional: Older male who appears sick and uncomfortable in the hospital gurney Vitals:   04/20/16 1928 04/20/16 2133  BP: (!) 136/106 139/100  Pulse: 110 88  Resp: 18 16  Temp: 98 F (36.7 C)   TempSrc: Oral    SpO2: 100% 99%   Eyes: PERRL, lids and conjunctivae normal ENMT: Mucous membranes are dry . Posterior pharynx clear of any exudate or lesions. Normal dentition.  Neck: normal, supple, no masses, no thyromegaly Respiratory: clear to auscultation bilaterally, no wheezing, no crackles. Normal respiratory effort. No accessory muscle use.  Cardiovascular: Regular rate and rhythm, no murmurs / rubs / gallops. No extremity edema. 2+ pedal pulses. No carotid bruits.  Abdomen: no tenderness(after pain medication), no masses palpated. No hepatosplenomegaly. Bowel sounds positive.  Musculoskeletal: no clubbing / cyanosis. No joint deformity upper and lower extremities. Good ROM, no contractures. Normal muscle tone.  Skin: Diaphoretic. no rashes, lesions, ulcers. No induration Neurologic: CN 2-12 grossly intact. Sensation intact, DTR normal. Strength 4+/5 in all 4.  Psychiatric: Normal judgment and insight. Alert and oriented x 3. Normal mood.     Labs on Admission: I have personally reviewed following labs and imaging studies  CBC:  Recent Labs Lab 04/20/16 1938  WBC 16.3*  HGB 16.4  HCT 44.8  MCV 85.5  PLT 093   Basic Metabolic Panel:  Recent Labs Lab 04/20/16 1938  NA 134*  K 3.3*  CL 92*  CO2 25  GLUCOSE 119*  BUN 25*  CREATININE 2.07*  CALCIUM 10.2   GFR: CrCl cannot be calculated (Unknown ideal weight.). Liver Function Tests:  Recent Labs Lab 04/20/16 1938  AST 43*  ALT 40  ALKPHOS 88  BILITOT 1.1  PROT 9.5*  ALBUMIN 5.0    Recent Labs Lab 04/20/16 1938  LIPASE 21   No results for input(s): AMMONIA in the last 168 hours. Coagulation Profile: No results for input(s): INR, PROTIME in the last 168 hours. Cardiac Enzymes: No results for input(s): CKTOTAL, CKMB, CKMBINDEX, TROPONINI in the last 168 hours. BNP (last 3 results) No results for input(s): PROBNP in the last 8760 hours. HbA1C: No results for input(s): HGBA1C in the last 72 hours. CBG: No  results for input(s): GLUCAP in the last 168 hours. Lipid Profile: No results for input(s): CHOL, HDL, LDLCALC, TRIG, CHOLHDL, LDLDIRECT in the last 72 hours. Thyroid Function Tests: No results for input(s): TSH, T4TOTAL, FREET4, T3FREE, THYROIDAB in the last 72 hours. Anemia Panel: No results for input(s): VITAMINB12, FOLATE, FERRITIN, TIBC, IRON, RETICCTPCT in the last 72 hours. Urine analysis:    Component Value Date/Time   COLORURINE ORANGE (A) 04/20/2016 2213   APPEARANCEUR CLOUDY (A) 04/20/2016 2213   LABSPEC 1.027 04/20/2016 2213   PHURINE 5.0 04/20/2016 2213   GLUCOSEU NEGATIVE 04/20/2016 2213   HGBUR NEGATIVE 04/20/2016 2213   BILIRUBINUR SMALL (A) 04/20/2016 2213   KETONESUR NEGATIVE 04/20/2016 2213   PROTEINUR 100 (A) 04/20/2016 2213   UROBILINOGEN 1.0 05/17/2013 0217   NITRITE NEGATIVE 04/20/2016 2213   LEUKOCYTESUR SMALL (A) 04/20/2016 2213   Sepsis Labs: No results found for this or any previous visit (from the past 240 hour(s)).   Radiological Exams on Admission: Ct Renal Stone Study  Result Date: 04/21/2016 CLINICAL DATA:  55 y/o M; constant worsening nausea and vomiting beginning 2 days ago, possibly related to food poisoning.  Generalized abdominal cramping, left lower back pain, and difficulty urinating. EXAM: CT ABDOMEN AND PELVIS WITHOUT CONTRAST TECHNIQUE: Multidetector CT imaging of the abdomen and pelvis was performed following the standard protocol without IV contrast. COMPARISON:  02/24/2013 CT of abdomen and pelvis. EEN fall off bed all FINDINGS: Lower chest: No acute abnormality. Hepatobiliary: Numerous cystic liver lesions are increased in size in comparison with the prior CT for example the lesion within segment 2 measures 48 mm, previously 43 mm and the cyst within the caudate extent exophytically into the mid abdomen. No biliary ductal dilatation. Normal gallbladder. Pancreas: Unremarkable. No pancreatic ductal dilatation or surrounding inflammatory  changes. Spleen: Normal in size without focal abnormality. Adrenals/Urinary Tract: Adrenal glands are unremarkable. Kidneys are normal, without renal calculi, focal lesion, or hydronephrosis. Bladder is unremarkable. Foley catheter in situ. Stable pelvic phleboliths. Stomach/Bowel: Stomach is within normal limits. No evidence of bowel wall thickening, distention, or inflammatory changes. Appendix is not visualized. Vascular/Lymphatic: Minimal aortic atherosclerosis. No enlarged abdominal or pelvic lymph nodes. Reproductive: Prostate is unremarkable. Other: No abdominal wall hernia or abnormality. No abdominopelvic ascites. Musculoskeletal: Severe degenerative changes of the right hip joint with loss of joint space, periarticular osteophytes and fibrocystic degeneration. Mild degenerative changes of the left hip joint. L2 through L5 posterior instrumented fusion, laminectomy, and interbody fusion with severe degenerative changes. L1-2 adjacent segment disease is new from prior CT. IMPRESSION: 1. No acute finding as explanation of abdominal pain. No obstructive uropathy or urinary stone disease. 2. Numerous cystic lesions within the liver are again increased in size from prior CT suggesting a progressive disorder such as polycystic liver disease or Caroli disease. 3. L2 through L5 posterior instrumented fusion. 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 Electronically Signed   By: Kristine Garbe M.D.   On: 04/21/2016 00:02      Assessment/Plan Acute renal failure: Baseline creatinine previously noted to be within normal limits at 0.92 in 10/2015, but creatinine 2.07 with BUN 25 on admission. CT of abdomen shows no signs of obstruction. UA positive for calcium oxalate crystals, positive ketones, and specific gravity at the upper limit of normal.. Patient was given 2 L of normal saline IV fluids in the ED. - Admit to MedSurg bed - Check FeNa - IVF NS at 100 ml/hr - Discontinue Foley  Leukocytosis:  Acute. WBC elevated to 16.3.  - Check lactic acid - Follow-up urine culture - Empiric antibiotics of Zosyn given 1 dose, reevaluate to determine if antibiotics need to be continued  - Repeat CBC in a.m.  Nausea, vomiting, and generalized abdominal pain/suspected gastroenteritis - Check CRP - Continue symptomatic care - Zofran - IV fluids NS  Elevated anion gap: Elevated anion gap of 17. Question if secondary to ketoacidosis - Continue to monitor  Hypokalemia: Acute. Potassium 3.3 on admission. - Potassium chloride IV 30 mEq  - Continue to monitor, and replace as needed  Back pain s/p lumbar fusion: Stable. CT scan noted new adjacent segment L1-L2 disease - Continue home regimen of Hydrocodone prn back pain  Liver cysts: Incidental finding on CT scan of multiple liver cysts that were noted to have increased in size from previous CT in 2014. - Will likely need further outpatient workup/monitoring  Elevated total protein: Patient with total protein of 9.5 with upper limit normal serum calcium.  - recheck following fluid hydration  Weight loss: Acute patient reports 20 pounds weight loss over the last  Months. - Check ESR and TSH - May warrent further investigation  DVT prophylaxis: Heparin   switched to Lovenox when able  Code Status: Full Family Communication: No family present at bedside  Disposition Plan: Likely discharge home when medically stable Consults called: none Admission status: Observation MedSurg   Norval Morton MD [facility] Pager [phone]  If 7PM-7AM, please contact night-coverage [email or web address] Password [email or web address]  04/21/2016, 1:52 AM

## 2016-04-21 NOTE — Progress Notes (Signed)
New Admission Note:   Arrival Method: via stretcher from ED Mental Orientation: Alert and oriented x4 Assessment: Completed Skin: Warm, dry, intact IV: Left AC Peripheral IV with NS @ 13625mL/hr Pain: 3/10, abdominal cramping pain Tubes: N/A Safety Measures: Educated on fall prevention safety plan, patient acknowledged and understood. Admission: Completed [facility] Orientation: Patient has been orientated to the room, unit and staff.  Family: N/A  Orders have been reviewed and implemented. Will continue to monitor the patient. Call light has been placed within reach and bed alarm has been activated.    Billy FischerLynn Abdikadir Fohl, RN

## 2016-04-21 NOTE — ED Triage Notes (Signed)
Gave report to RN on [facility].  

## 2016-04-21 NOTE — Progress Notes (Addendum)
[facility] PROGRESS NOTE    Progress Note  Joshua Baldwin  QPY:[id number] DOB: 1961/06/23 DOA: 04/20/2016 PCP: Joshua Bastos, MD     Brief Narrative:   Joshua Baldwin is an 55 y.o. male past medical history of back pain status post lumbar fusion, who presents with complaint of nausea vomiting started 3 days prior to admission.  Assessment/Plan:   AKI: Baseline creatinine less than 1 last checked on 3 .2017, on admission it was 2.0. CT scan of the abdomen and pelvis show no signs of obstruction. UA was positive for oxalate crystals. Fractional excretion of sodium is less than 1 likely prerenal continue IV fluid hydration creatinine is improving slowly. Discontinue Foley.  Hyperkalemia: Likely to to dehydration replete orally. Repeat a basic metabolic panel in the morning.  Leukocytosis: He was given a single dose of IV Zosyn in the ED, UA shows 0-5 white blood cells and a few bacteria asymptomatic. Continue hold IV antibiotics he has remained febrile, and is likely stress demargination likely due to acute renal failure. Repeat a CBC. CRP is low ESR is 19.  Nausea and vomiting: He relates that his improve compare to his episodes that he had at home, only a mild small episode this morning. We'll continue him on a clear liquid diet. He has remained afebrile repeat a CBC. He has no alarming symptoms his hemoglobin is stable he's only had a 4 kg weight loss since he was last in the hospital that could be due to volume depletion.  Hypokalemia: Replete orally likely due to vomiting and prerenal etiology or Joshua Baldwin basic metabolic panel in the morning.  Elevated anion gap: Likely secondary to dehydration resolved with IV fluid hydration.  Back pain status post lumbar fusion: Continue when necessary hydrocodone he relates his back pain is unchanged.  Incidental Liver cyst: Seen on CT increase in size from previous in 2014 will need further workup as an  outpatient.  Elevated total protein: Resolved likely due to dehydration.  Hypercalcemia: Resolved with IV fluid hydration   DVT prophylaxis: lovenox Family Communication:none Disposition Plan/Barrier to D/C: home in 1-2 days Code Status:     Code Status Orders        Start     Ordered   04/21/16 0221  Full code  Continuous     04/21/16 0226    Code Status History    Date Active Date Inactive Code Status Order ID Comments User Context   05/15/2013  5:10 PM 05/18/2013  3:29 PM Full Code [id number]  Faythe Ghee, MD Inpatient        IV Access:    Peripheral IV   Procedures and diagnostic studies:   Dg Chest Port 1 View  Result Date: 04/21/2016 CLINICAL DATA:  Weight loss and vomiting. EXAM: PORTABLE CHEST 1 VIEW COMPARISON:  Radiograph 11/02/2015. Included lung bases from CT abdomen/ pelvis 2 hours prior. FINDINGS: The cardiomediastinal contours are normal. The lungs are clear. Previous right lower lobe consolidation has resolved. Pulmonary vasculature is normal. No consolidation, pleural effusion, or pneumothorax. No acute osseous abnormalities are seen. IMPRESSION: No active disease. Electronically Signed   By: Jeb Levering M.D.   On: 04/21/2016 03:24   Ct Renal Stone Study  Result Date: 04/21/2016 CLINICAL DATA:  55 y/o M; constant worsening nausea and vomiting beginning 2 days ago, possibly related to food poisoning. Generalized abdominal cramping, left lower back pain, and difficulty urinating. EXAM: CT ABDOMEN AND PELVIS WITHOUT CONTRAST TECHNIQUE: Multidetector CT imaging of the abdomen and  pelvis was performed following the standard protocol without IV contrast. COMPARISON:  02/24/2013 CT of abdomen and pelvis. EEN fall off bed all FINDINGS: Lower chest: No acute abnormality. Hepatobiliary: Numerous cystic liver lesions are increased in size in comparison with the prior CT for example the lesion within segment 2 measures 48 mm, previously 43 mm and the cyst  within the caudate extent exophytically into the mid abdomen. No biliary ductal dilatation. Normal gallbladder. Pancreas: Unremarkable. No pancreatic ductal dilatation or surrounding inflammatory changes. Spleen: Normal in size without focal abnormality. Adrenals/Urinary Tract: Adrenal glands are unremarkable. Kidneys are normal, without renal calculi, focal lesion, or hydronephrosis. Bladder is unremarkable. Foley catheter in situ. Stable pelvic phleboliths. Stomach/Bowel: Stomach is within normal limits. No evidence of bowel wall thickening, distention, or inflammatory changes. Appendix is not visualized. Vascular/Lymphatic: Minimal aortic atherosclerosis. No enlarged abdominal or pelvic lymph nodes. Reproductive: Prostate is unremarkable. Other: No abdominal wall hernia or abnormality. No abdominopelvic ascites. Musculoskeletal: Severe degenerative changes of the right hip joint with loss of joint space, periarticular osteophytes and fibrocystic degeneration. Mild degenerative changes of the left hip joint. L2 through L5 posterior instrumented fusion, laminectomy, and interbody fusion with severe degenerative changes. L1-2 adjacent segment disease is new from prior CT. IMPRESSION: 1. No acute finding as explanation of abdominal pain. No obstructive uropathy or urinary stone disease. 2. Numerous cystic lesions within the liver are again increased in size from prior CT suggesting a progressive disorder such as polycystic liver disease or Caroli disease. 3. L2 through L5 posterior instrumented fusion. 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 Electronically Signed   By: Kristine Garbe M.D.   On: 04/21/2016 00:02     Medical Consultants:    None.  Anti-Infectives:   Single dose of Zosyn on 04/20/2016  Subjective:    Joshua Baldwin he relates his nausea and vomiting are resolved. He feels much better than yesterday.  Objective:    Vitals:   04/20/16 1928 04/20/16 2133 04/21/16 0249  04/21/16 0622  BP: (!) 136/106 139/100 (!) 143/94 (!) 137/93  Pulse: 110 88 (!) 51 64  Resp: '18 16 16 16  '$ Temp: 98 F (36.7 C)  98.4 F (36.9 C) 98.3 F (36.8 C)  TempSrc: Oral  Oral Oral  SpO2: 100% 99% 99% 100%  Weight:   56.2 kg (124 lb)   Height:   '5\' 7"'$  (1.702 m)     Intake/Output Summary (Last 24 hours) at 04/21/16 1043 Last data filed at 04/21/16 0624  Gross per 24 hour  Intake               30 ml  Output             1200 ml  Net            -1170 ml   Filed Weights   04/21/16 0249  Weight: 56.2 kg (124 lb)    Exam: General exam: In no acute distress. Respiratory system: Good air movement and clear to auscultation. Cardiovascular system: S1 & S2 heard, RRR. No JVD, murmurs, rubs, gallops or clicks.  Gastrointestinal system: Abdomen is nondistended, soft and nontender.  Central nervous system: Alert and oriented. No focal neurological deficits. Extremities: No pedal edema. Skin: No rashes, lesions or ulcers Psychiatry: Judgement and insight appear normal. Mood & affect appropriate.    Data Reviewed:    Labs: Basic Metabolic Panel:  Recent Labs Lab 04/20/16 1938 04/21/16 0323  NA 134* 138  K 3.3* 3.2*  CL 92* 99*  CO2 25 29  GLUCOSE 119* 112*  BUN 25* 27*  CREATININE 2.07* 1.66*  CALCIUM 10.2 8.8*   GFR Estimated Creatinine Clearance: 40 mL/min (by C-G formula based on SCr of 1.66 mg/dL (H)). Liver Function Tests:  Recent Labs Lab 04/20/16 1938 04/21/16 0323  AST 43* 37  ALT 40 35  ALKPHOS 88 73  BILITOT 1.1 1.0  PROT 9.5* 7.5  ALBUMIN 5.0 4.0    Recent Labs Lab 04/20/16 1938  LIPASE 21   No results for input(s): AMMONIA in the last 168 hours. Coagulation profile No results for input(s): INR, PROTIME in the last 168 hours.  CBC:  Recent Labs Lab 04/20/16 1938  WBC 16.3*  HGB 16.4  HCT 44.8  MCV 85.5  PLT 345   Cardiac Enzymes: No results for input(s): CKTOTAL, CKMB, CKMBINDEX, TROPONINI in the last 168 hours. BNP (last  3 results) No results for input(s): PROBNP in the last 8760 hours. CBG: No results for input(s): GLUCAP in the last 168 hours. D-Dimer: No results for input(s): DDIMER in the last 72 hours. Hgb A1c: No results for input(s): HGBA1C in the last 72 hours. Lipid Profile: No results for input(s): CHOL, HDL, LDLCALC, TRIG, CHOLHDL, LDLDIRECT in the last 72 hours. Thyroid function studies:  Recent Labs  04/21/16 0323  TSH 0.230*   Anemia work up: No results for input(s): VITAMINB12, FOLATE, FERRITIN, TIBC, IRON, RETICCTPCT in the last 72 hours. Sepsis Labs:  Recent Labs Lab 04/20/16 1938 04/21/16 0323 04/21/16 0632  WBC 16.3*  --   --   LATICACIDVEN  --  1.4 1.6   Microbiology Recent Results (from the past 240 hour(s))  MRSA PCR Screening     Status: Abnormal   Collection Time: 04/21/16  3:14 AM  Result Value Ref Range Status   MRSA by PCR POSITIVE (A) NEGATIVE Final    Comment:        The GeneXpert MRSA Assay (FDA approved for NASAL specimens only), is one component of a comprehensive MRSA colonization surveillance program. It is not intended to diagnose MRSA infection nor to guide or monitor treatment for MRSA infections. RESULT CALLED TO, READ BACK BY AND VERIFIED WITH: L.Aviva Kluver [id number] 04/21/16 W.SHEA      Medications:   . Chlorhexidine Gluconate Cloth  6 each Topical Q0600  . heparin  5,000 Units Subcutaneous Q8H  . mupirocin ointment  1 application Nasal BID   Continuous Infusions: . sodium chloride 125 mL/hr at 04/21/16 0301    Time spent: 25 min   LOS: 0 days   Charlynne Cousins  [facility] Pager [phone]  *Please refer to [email or web address], password [email or web address] to get updated schedule on who will round on this patient, as hospitalists switch teams weekly. If 7PM-7AM, please contact night-coverage at [email or web address], password [email or web address] for any overnight needs.  04/21/2016, 10:43 AM

## 2016-04-21 NOTE — Progress Notes (Signed)
Patient Potassium level 3.2, orders from admission for 3 runs of K+. Patient unable to tolerate. Notified on call, K. Schorr. New orders received for 40 meq PO potassium. Will administer and monitor.

## 2016-04-22 ENCOUNTER — Inpatient Hospital Stay (HOSPITAL_COMMUNITY): Payer: Medicaid Other

## 2016-04-22 DIAGNOSIS — R1110 Vomiting, unspecified: Secondary | ICD-10-CM

## 2016-04-22 DIAGNOSIS — D72829 Elevated white blood cell count, unspecified: Secondary | ICD-10-CM

## 2016-04-22 DIAGNOSIS — E876 Hypokalemia: Secondary | ICD-10-CM

## 2016-04-22 DIAGNOSIS — N179 Acute kidney failure, unspecified: Principal | ICD-10-CM

## 2016-04-22 LAB — URINE CULTURE: Culture: NO GROWTH

## 2016-04-22 LAB — BASIC METABOLIC PANEL
Anion gap: 7 (ref 5–15)
BUN: 15 mg/dL (ref 6–20)
CALCIUM: 8.5 mg/dL — AB (ref 8.9–10.3)
CO2: 26 mmol/L (ref 22–32)
CREATININE: 0.96 mg/dL (ref 0.61–1.24)
Chloride: 103 mmol/L (ref 101–111)
GFR calc non Af Amer: >60 mL/min (ref >60)
Glucose, Bld: 99 mg/dL (ref 65–99)
Potassium: 3.7 mmol/L (ref 3.5–5.1)
SODIUM: 136 mmol/L (ref 135–145)

## 2016-04-22 LAB — T3, FREE: T3 FREE: 3 pg/mL (ref 2.0–4.4)

## 2016-04-22 MED ORDER — CIPROFLOXACIN IN D5W 400 MG/200ML IV SOLN
400.0000 mg | Freq: Two times a day (BID) | INTRAVENOUS | Status: DC
  Administered 2016-04-22 – 2016-04-23 (×3): 400 mg via INTRAVENOUS
  Filled 2016-04-22 (×3): qty 200

## 2016-04-22 MED ORDER — METRONIDAZOLE IN NACL 5-0.79 MG/ML-% IV SOLN
500.0000 mg | Freq: Three times a day (TID) | INTRAVENOUS | Status: DC
  Administered 2016-04-22 – 2016-04-23 (×4): 500 mg via INTRAVENOUS
  Filled 2016-04-22 (×4): qty 100

## 2016-04-22 MED ORDER — IOPAMIDOL (ISOVUE-300) INJECTION 61%: 30.0000 mL | Freq: Once | INTRAVENOUS | Status: DC | PRN

## 2016-04-22 MED ORDER — IOPAMIDOL (ISOVUE-300) INJECTION 61%
100.0000 mL | Freq: Once | INTRAVENOUS | Status: AC | PRN
  Administered 2016-04-22: 100 mL via INTRAVENOUS

## 2016-04-22 MED ORDER — HYDROMORPHONE HCL 1 MG/ML IJ SOLN
0.5000 mg | Freq: Once | INTRAMUSCULAR | Status: AC
  Administered 2016-04-22: 0.5 mg via INTRAVENOUS
  Filled 2016-04-22: qty 1

## 2016-04-22 NOTE — Progress Notes (Signed)
[facility] PROGRESS NOTE    Progress Note  Joshua Baldwin  WUJ:[id number] DOB: 1961/05/17 DOA: 04/20/2016 PCP: Jacki Cones, MD     Brief Narrative:   Joshua Baldwin is an 54 y.o. male past medical history of back pain status post lumbar fusion, who presents with complaint of nausea vomiting started 3 days prior to admission.  Assessment/Plan:   AKI: - Baseline creatinine less than 1 last checked on 3 .2017, on admission it was 2.0.Likely prerenal. - There is resolved with IV fluid hydration, the patient is nothing by mouth we'll continue IV fluids.  Leukocytosis with Nausea and vomiting: - He was given a single dose of IV Zosyn in the ED, UA shows 0-5 white blood cells and a few bacteria asymptomatic. - He is now complaining of left lower quadrant pain he relates he had a colonoscopy at the age of 50 as a screening colonoscopy showed a polyp and diverticulosis, I am concerned about diverticulitis he has no rebound or guarding, will get a - Check CT scan with contrast of the abdomen and pelvis and start him empirically on IV ciprofloxacin and Flagyl. He did not tolerate clear liquid diet he continues to be nauseated and vomiting. - He has remained afebrile, but his leukocytosis continues to worsen. - He has no alarming symptoms his hemoglobin is stable he's only had a 4 kg weight loss.  Hypokalemia: Replete orally likely due to vomiting and prerenal etiology.  Elevated anion gap: Likely secondary to dehydration resolved with IV fluid hydration.  Back pain status post lumbar fusion: Continue when necessary hydrocodone he relates his back pain is unchanged.  Incidental Liver cyst: Seen on CT increase in size from previous in 2014 will need further workup as an outpatient.  Elevated total protein: Resolved likely due to dehydration.  Hypercalcemia: Resolved with IV fluid hydration   DVT prophylaxis: lovenox Family Communication:none Disposition Plan/Barrier to  D/C: home in 2-3 days Code Status:     Code Status Orders        Start     Ordered   04/21/16 0221  Full code  Continuous     04/21/16 0226    Code Status History    Date Active Date Inactive Code Status Order ID Comments User Context   05/15/2013  5:10 PM 05/18/2013  3:29 PM Full Code [id number]  Reinaldo Meeker, MD Inpatient        IV Access:    Peripheral IV   Procedures and diagnostic studies:   Dg Chest Port 1 View  Result Date: 04/21/2016 CLINICAL DATA:  Weight loss and vomiting. EXAM: PORTABLE CHEST 1 VIEW COMPARISON:  Radiograph 11/02/2015. Included lung bases from CT abdomen/ pelvis 2 hours prior. FINDINGS: The cardiomediastinal contours are normal. The lungs are clear. Previous right lower lobe consolidation has resolved. Pulmonary vasculature is normal. No consolidation, pleural effusion, or pneumothorax. No acute osseous abnormalities are seen. IMPRESSION: No active disease. Electronically Signed   By: Rubye Oaks M.D.   On: 04/21/2016 03:24   Ct Renal Stone Study  Result Date: 04/21/2016 CLINICAL DATA:  55 y/o M; constant worsening nausea and vomiting beginning 2 days ago, possibly related to food poisoning. Generalized abdominal cramping, left lower back pain, and difficulty urinating. EXAM: CT ABDOMEN AND PELVIS WITHOUT CONTRAST TECHNIQUE: Multidetector CT imaging of the abdomen and pelvis was performed following the standard protocol without IV contrast. COMPARISON:  02/24/2013 CT of abdomen and pelvis. EEN fall off bed all FINDINGS: Lower chest: No acute abnormality.  Hepatobiliary: Numerous cystic liver lesions are increased in size in comparison with the prior CT for example the lesion within segment 2 measures 48 mm, previously 43 mm and the cyst within the caudate extent exophytically into the mid abdomen. No biliary ductal dilatation. Normal gallbladder. Pancreas: Unremarkable. No pancreatic ductal dilatation or surrounding inflammatory changes. Spleen: Normal  in size without focal abnormality. Adrenals/Urinary Tract: Adrenal glands are unremarkable. Kidneys are normal, without renal calculi, focal lesion, or hydronephrosis. Bladder is unremarkable. Foley catheter in situ. Stable pelvic phleboliths. Stomach/Bowel: Stomach is within normal limits. No evidence of bowel wall thickening, distention, or inflammatory changes. Appendix is not visualized. Vascular/Lymphatic: Minimal aortic atherosclerosis. No enlarged abdominal or pelvic lymph nodes. Reproductive: Prostate is unremarkable. Other: No abdominal wall hernia or abnormality. No abdominopelvic ascites. Musculoskeletal: Severe degenerative changes of the right hip joint with loss of joint space, periarticular osteophytes and fibrocystic degeneration. Mild degenerative changes of the left hip joint. L2 through L5 posterior instrumented fusion, laminectomy, and interbody fusion with severe degenerative changes. L1-2 adjacent segment disease is new from prior CT. IMPRESSION: 1. No acute finding as explanation of abdominal pain. No obstructive uropathy or urinary stone disease. 2. Numerous cystic lesions within the liver are again increased in size from prior CT suggesting a progressive disorder such as polycystic liver disease or Caroli disease. 3. L2 through L5 posterior instrumented fusion. 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 Electronically Signed   By: Mitzi HansenLance  Furusawa-Stratton M.D.   On: 04/21/2016 00:02     Medical Consultants:    None.  Anti-Infectives:   Single dose of Zosyn on 04/20/2016  Subjective:    Joshua Baldwin he continues to be nauseated and has vomited 3 times. Complaining of left lower quadrant abdominal pain.  Objective:    Vitals:   04/21/16 0622 04/21/16 1315 04/21/16 2216 04/22/16 0527  BP: (!) 137/93 129/82 139/88 (!) 139/93  Pulse: 64 (!) 57 (!) 58 62  Resp: 16   16  Temp: 98.3 F (36.8 C) 98.2 F (36.8 C) 99.1 F (37.3 C) 99 F (37.2 C)  TempSrc: Oral Oral Oral  Oral  SpO2: 100% 99% 99% 99%  Weight:      Height:        Intake/Output Summary (Last 24 hours) at 04/22/16 1022 Last data filed at 04/22/16 0700  Gross per 24 hour  Intake             3365 ml  Output             1825 ml  Net             1540 ml   Filed Weights   04/21/16 0249  Weight: 56.2 kg (124 lb)    Exam: General exam: In no acute distress. Respiratory system: Good air movement and clear to auscultation. Cardiovascular system: S1 & S2 heard, RRR. No JVD, murmurs, rubs, gallops or clicks.  Gastrointestinal system: Diminished nondistended soft with left lower quadrant tenderness no rebound or guarding. Central nervous system: Alert and oriented. No focal neurological deficits. Extremities: No pedal edema. Skin: No rashes, lesions or ulcers Psychiatry: Judgement and insight appear normal. Mood & affect appropriate.    Data Reviewed:    Labs: Basic Metabolic Panel:  Recent Labs Lab 04/20/16 1938 04/21/16 0323 04/22/16 0343  NA 134* 138 136  K 3.3* 3.2* 3.7  CL 92* 99* 103  CO2 25 29 26   GLUCOSE 119* 112* 99  BUN 25* 27* 15  CREATININE 2.07* 1.66* 0.96  CALCIUM 10.2 8.8* 8.5*   GFR Estimated Creatinine Clearance: 69.1 mL/min (by C-G formula based on SCr of 0.96 mg/dL). Liver Function Tests:  Recent Labs Lab 04/20/16 1938 04/21/16 0323  AST 43* 37  ALT 40 35  ALKPHOS 88 73  BILITOT 1.1 1.0  PROT 9.5* 7.5  ALBUMIN 5.0 4.0    Recent Labs Lab 04/20/16 1938  LIPASE 21   No results for input(s): AMMONIA in the last 168 hours. Coagulation profile No results for input(s): INR, PROTIME in the last 168 hours.  CBC:  Recent Labs Lab 04/20/16 1938 04/21/16 1104  WBC 16.3* 17.4*  HGB 16.4 13.4  HCT 44.8 37.4*  MCV 85.5 86.6  PLT 345 287   Cardiac Enzymes: No results for input(s): CKTOTAL, CKMB, CKMBINDEX, TROPONINI in the last 168 hours. BNP (last 3 results) No results for input(s): PROBNP in the last 8760 hours. CBG: No results for  input(s): GLUCAP in the last 168 hours. D-Dimer: No results for input(s): DDIMER in the last 72 hours. Hgb A1c: No results for input(s): HGBA1C in the last 72 hours. Lipid Profile: No results for input(s): CHOL, HDL, LDLCALC, TRIG, CHOLHDL, LDLDIRECT in the last 72 hours. Thyroid function studies:  Recent Labs  04/21/16 0323  TSH 0.230*  T3FREE 3.0   Anemia work up: No results for input(s): VITAMINB12, FOLATE, FERRITIN, TIBC, IRON, RETICCTPCT in the last 72 hours. Sepsis Labs:  Recent Labs Lab 04/20/16 1938 04/21/16 0323 04/21/16 0632 04/21/16 1104  WBC 16.3*  --   --  17.4*  LATICACIDVEN  --  1.4 1.6  --    Microbiology Recent Results (from the past 240 hour(s))  MRSA PCR Screening     Status: Abnormal   Collection Time: 04/21/16  3:14 AM  Result Value Ref Range Status   MRSA by PCR POSITIVE (A) NEGATIVE Final    Comment:        The GeneXpert MRSA Assay (FDA approved for NASAL specimens only), is one component of a comprehensive MRSA colonization surveillance program. It is not intended to diagnose MRSA infection nor to guide or monitor treatment for MRSA infections. RESULT CALLED TO, READ BACK BY AND VERIFIED WITH: L.Jamal Maes [id number] 04/21/16 W.SHEA      Medications:   . Chlorhexidine Gluconate Cloth  6 each Topical Q0600  . heparin  5,000 Units Subcutaneous Q8H  . mupirocin ointment  1 application Nasal BID  . pantoprazole  40 mg Oral BID   Continuous Infusions: . sodium chloride 125 mL/hr at 04/22/16 0807    Time spent: 35 min   LOS: 1 day   Marinda Elk  [facility] Pager [phone]  *Please refer to [email or web address], password [email or web address] to get updated schedule on who will round on this patient, as hospitalists switch teams weekly. If 7PM-7AM, please contact night-coverage at [email or web address], password [email or web address] for any overnight needs.  04/22/2016, 10:22 AM

## 2016-04-22 NOTE — Progress Notes (Signed)
CM consult for PCP. This CM met with pt at bedside to give PCP resources. Pt states he does have a PCP (Dr. Nolene Ebbs). He states he plans on following up with him at discharge. Pt given [place] packet as a backup resource. No other CM needs communicated. Marney Doctor RN,BSN,NCM

## 2016-04-23 DIAGNOSIS — K5732 Diverticulitis of large intestine without perforation or abscess without bleeding: Secondary | ICD-10-CM

## 2016-04-23 LAB — BASIC METABOLIC PANEL
ANION GAP: 6 (ref 5–15)
BUN: 13 mg/dL (ref 6–20)
CALCIUM: 8.5 mg/dL — AB (ref 8.9–10.3)
CHLORIDE: 102 mmol/L (ref 101–111)
CO2: 26 mmol/L (ref 22–32)
Creatinine, Ser: 0.91 mg/dL (ref 0.61–1.24)
GFR calc non Af Amer: >60 mL/min (ref >60)
GLUCOSE: 83 mg/dL (ref 65–99)
POTASSIUM: 3.4 mmol/L — AB (ref 3.5–5.1)
Sodium: 134 mmol/L — ABNORMAL LOW (ref 135–145)

## 2016-04-23 LAB — CBC
HEMATOCRIT: 31.5 % — AB (ref 39.0–52.0)
HEMOGLOBIN: 11.4 g/dL — AB (ref 13.0–17.0)
MCH: 30.8 pg (ref 26.0–34.0)
MCHC: 36.2 g/dL — ABNORMAL HIGH (ref 30.0–36.0)
MCV: 85.1 fL (ref 78.0–100.0)
Platelets: 238 10*3/uL (ref 150–400)
RBC: 3.7 MIL/uL — AB (ref 4.22–5.81)
RDW: 12.2 % (ref 11.5–15.5)
WBC: 9.3 10*3/uL (ref 4.0–10.5)

## 2016-04-23 MED ORDER — CIPROFLOXACIN IN D5W 400 MG/200ML IV SOLN
400.0000 mg | Freq: Two times a day (BID) | INTRAVENOUS | Status: DC
  Administered 2016-04-23: 400 mg via INTRAVENOUS
  Filled 2016-04-23 (×2): qty 200

## 2016-04-23 MED ORDER — METRONIDAZOLE 500 MG PO TABS: 500.0000 mg | ORAL_TABLET | Freq: Three times a day (TID) | ORAL | Status: DC

## 2016-04-23 MED ORDER — METRONIDAZOLE 500 MG PO TABS
500.0000 mg | ORAL_TABLET | Freq: Three times a day (TID) | ORAL | Status: DC
  Administered 2016-04-23 – 2016-04-24 (×2): 500 mg via ORAL
  Filled 2016-04-23 (×2): qty 1

## 2016-04-23 MED ORDER — POTASSIUM CHLORIDE CRYS ER 20 MEQ PO TBCR
40.0000 meq | EXTENDED_RELEASE_TABLET | Freq: Two times a day (BID) | ORAL | Status: AC
  Administered 2016-04-23 (×2): 40 meq via ORAL
  Filled 2016-04-23 (×2): qty 2

## 2016-04-23 NOTE — Progress Notes (Signed)
[facility] PROGRESS NOTE    Progress Note  Joshua Baldwin  ZOX:[id number] DOB: 13-Feb-1961 DOA: 04/20/2016 PCP: Jacki Cones, MD     Brief Narrative:   Joshua Baldwin is an 55 y.o. male past medical history of back pain status post lumbar fusion, who presents with complaint of nausea vomiting started 3 days prior to admission.  Assessment/Plan:   AKI: - Baseline creatinine less than 1 last checked on 3 .2017, on admission it was 2.0.Likely prerenal. - There is resolved with IV fluid hydration, the patient is nothing by mouth we'll continue IV fluids.  Leukocytosis with Nausea and vomiting, question due to early diverticulitis: - He is now complaining of left lower quadrant pain he relates he had a colonoscopy at the age of 64 as a screening colonoscopy showed a polyp and diverticulosis, I am concerned about diverticulitis he has no rebound or guarding, will get a CT scan of the abdomen and pelvis to show free fluid, and multiple cysts (which were present on CT scan of 02/24/2013 with similar size.) LFTs are within normal limits. -He was started empirically on IV ciprofloxacin and Flagyl by the next day he felt much better was tolerating his diet. His leukocytosis resolved. - Question is episode of leukocytosis and nausea vomiting and left lower quadrant pain instead likely due to early diverticulitis  Hypokalemia: Replete orally, likely prerenal recheck a basic metabolic panel in the morning.  Elevated anion gap: Likely secondary to dehydration resolved with IV fluid hydration.  Back pain status post lumbar fusion: Continue when necessary hydrocodone he relates his back pain is unchanged.  Incidental Liver cyst: Seen on CT increase in size from previous in 2014 will need further workup as an outpatient.  Elevated total protein: Resolved likely due to dehydration.  Hypercalcemia: Resolved, with IV fluid hydration   DVT prophylaxis: lovenox Family  Communication:none Disposition Plan/Barrier to D/C: home in am Code Status:     Code Status Orders        Start     Ordered   04/21/16 0221  Full code  Continuous     04/21/16 0226    Code Status History    Date Active Date Inactive Code Status Order ID Comments User Context   05/15/2013  5:10 PM 05/18/2013  3:29 PM Full Code [id number]  Reinaldo Meeker, MD Inpatient        IV Access:    Peripheral IV   Procedures and diagnostic studies:   Ct Abdomen Pelvis W Contrast  Result Date: 04/22/2016 CLINICAL DATA:  Left lower quadrant pain.  Nausea and vomiting. EXAM: CT ABDOMEN AND PELVIS WITH CONTRAST TECHNIQUE: Multidetector CT imaging of the abdomen and pelvis was performed using the standard protocol following bolus administration of intravenous contrast. CONTRAST:  ISOVUE-300 IOPAMIDOL (ISOVUE-300) INJECTION 61% COMPARISON:  04/20/2016 noncontrast CT of the abdomen and pelvis FINDINGS: Lower chest: Dependent subsegmental atelectasis in both lower lobes. No pleural effusion. Hepatobiliary: Innumerable low-density lesions are again seen throughout the liver, with the largest measuring 5.0 cm in the left hepatic lobe and unchanged from the recent prior CT, most compatible with cysts. Unremarkable gallbladder. No biliary dilatation. Pancreas: Unremarkable. Spleen: Unremarkable. Adrenals/Urinary Tract: Unremarkable appearance of the adrenal glands, kidneys, and bladder. No renal calculi or hydronephrosis identified. Stomach/Bowel: There is no evidence of bowel obstruction. Oral contrast is present in loops of distal small bowel as well as in the colon to the level of the descending colon. Assessment for bowel wall thickening/inflammation is limited as  the sigmoid colon and majority of small bowel loops are decompressed, and there is a paucity of peritoneal fat. The appendix is opacified with contrast material and is unremarkable. Vascular/Lymphatic: Minimal abdominal aortic  atherosclerosis without aneurysm. No enlarged lymph nodes identified. Reproductive: Unremarkable prostate. Other: New small volume free fluid in the pelvis. The no abdominal wall hernia. Musculoskeletal: Severe right and mild-to-moderate left hip joint arthropathic changes again noted. Prior L2-L5 posterior decompression and fusion. Severe disc degeneration on the left at L1-2. IMPRESSION: 1. New small volume pelvic free fluid, abnormal but of uncertain etiology. Limited assessment for bowel inflammation due to underdistention and paucity of intraperitoneal fat. 2. Unchanged numerous cystic liver lesions. 3. Minimal aortic atherosclerosis. Electronically Signed   By: Sebastian Ache M.D.   On: 04/22/2016 22:43     Medical Consultants:    None.  Anti-Infectives:   Single dose of Zosyn on 04/20/2016  Subjective:    Karl Pock abdominal pain feels better than yesterday he relates he is hungry and like to try a regular diet.  Objective:    Vitals:   04/22/16 0527 04/22/16 1352 04/22/16 2245 04/23/16 0641  BP: (!) 139/93 (!) 148/90 (!) 141/89 127/88  Pulse: 62 (!) 52 (!) 59 (!) 51  Resp: 16 16 16 16   Temp: 99 F (37.2 C) 98.5 F (36.9 C) 98 F (36.7 C) 98 F (36.7 C)  TempSrc: Oral Oral Oral Oral  SpO2: 99% 100% 100% 100%  Weight:      Height:        Intake/Output Summary (Last 24 hours) at 04/23/16 1141 Last data filed at 04/23/16 0426  Gross per 24 hour  Intake             1600 ml  Output             1350 ml  Net              250 ml   Filed Weights   04/21/16 0249  Weight: 56.2 kg (124 lb)    Exam: General exam: In no acute distress. Respiratory system: Good air movement and clear to auscultation. Cardiovascular system: S1 & S2 heard, RRR.  Gastrointestinal system: Abdomen is nondistended soft left lower quadrant pain to palpation resolved. Central nervous system: Alert and oriented. No focal neurological deficits. Extremities: No pedal edema. Skin: No rashes,  lesions or ulcers Psychiatry: Judgement and insight appear normal. Mood & affect appropriate.    Data Reviewed:    Labs: Basic Metabolic Panel:  Recent Labs Lab 04/20/16 1938 04/21/16 0323 04/22/16 0343 04/23/16 0422  NA 134* 138 136 134*  K 3.3* 3.2* 3.7 3.4*  CL 92* 99* 103 102  CO2 25 29 26 26   GLUCOSE 119* 112* 99 83  BUN 25* 27* 15 13  CREATININE 2.07* 1.66* 0.96 0.91  CALCIUM 10.2 8.8* 8.5* 8.5*   GFR Estimated Creatinine Clearance: 72.9 mL/min (by C-G formula based on SCr of 0.91 mg/dL). Liver Function Tests:  Recent Labs Lab 04/20/16 1938 04/21/16 0323  AST 43* 37  ALT 40 35  ALKPHOS 88 73  BILITOT 1.1 1.0  PROT 9.5* 7.5  ALBUMIN 5.0 4.0    Recent Labs Lab 04/20/16 1938  LIPASE 21   No results for input(s): AMMONIA in the last 168 hours. Coagulation profile No results for input(s): INR, PROTIME in the last 168 hours.  CBC:  Recent Labs Lab 04/20/16 1938 04/21/16 1104 04/23/16 0422  WBC 16.3* 17.4* 9.3  HGB 16.4 13.4 11.4*  HCT 44.8 37.4* 31.5*  MCV 85.5 86.6 85.1  PLT 345 287 238   Cardiac Enzymes: No results for input(s): CKTOTAL, CKMB, CKMBINDEX, TROPONINI in the last 168 hours. BNP (last 3 results) No results for input(s): PROBNP in the last 8760 hours. CBG: No results for input(s): GLUCAP in the last 168 hours. D-Dimer: No results for input(s): DDIMER in the last 72 hours. Hgb A1c: No results for input(s): HGBA1C in the last 72 hours. Lipid Profile: No results for input(s): CHOL, HDL, LDLCALC, TRIG, CHOLHDL, LDLDIRECT in the last 72 hours. Thyroid function studies:  Recent Labs  04/21/16 0323  TSH 0.230*  T3FREE 3.0   Anemia work up: No results for input(s): VITAMINB12, FOLATE, FERRITIN, TIBC, IRON, RETICCTPCT in the last 72 hours. Sepsis Labs:  Recent Labs Lab 04/20/16 1938 04/21/16 0323 04/21/16 0632 04/21/16 1104 04/23/16 0422  WBC 16.3*  --   --  17.4* 9.3  LATICACIDVEN  --  1.4 1.6  --   --     Microbiology Recent Results (from the past 240 hour(s))  Urine culture     Status: None   Collection Time: 04/20/16 10:13 PM  Result Value Ref Range Status   Specimen Description URINE, RANDOM  Final   Special Requests NONE  Final   [facility]   Final   Report Status 04/22/2016 FINAL  Final  MRSA PCR Screening     Status: Abnormal   Collection Time: 04/21/16  3:14 AM  Result Value Ref Range Status   MRSA by PCR POSITIVE (A) NEGATIVE Final    Comment:        The GeneXpert MRSA Assay (FDA approved for NASAL specimens only), is one component of a comprehensive MRSA colonization surveillance program. It is not intended to diagnose MRSA infection nor to guide or monitor treatment for MRSA infections. RESULT CALLED TO, READ BACK BY AND VERIFIED WITH: L.Jamal MaesBANG,RN [id number] 04/21/16 W.SHEA      Medications:   . Chlorhexidine Gluconate Cloth  6 each Topical Q0600  . ciprofloxacin  400 mg Intravenous Q12H  . heparin  5,000 Units Subcutaneous Q8H  . metronidazole  500 mg Intravenous Q8H  . mupirocin ointment  1 application Nasal BID  . pantoprazole  40 mg Oral BID   Continuous Infusions: . sodium chloride 1,000 mL (04/23/16 1044)    Time spent: 25 min   LOS: 2 days   Marinda ElkFELIZ ORTIZ, Arney Mayabb  [facility] Pager [phone]  *Please refer to [email or web address], password [email or web address] to get updated schedule on who will round on this patient, as hospitalists switch teams weekly. If 7PM-7AM, please contact night-coverage at [email or web address], password [email or web address] for any overnight needs.  04/23/2016, 11:41 AM

## 2016-04-24 DIAGNOSIS — K5732 Diverticulitis of large intestine without perforation or abscess without bleeding: Secondary | ICD-10-CM

## 2016-04-24 LAB — CBC
HEMATOCRIT: 31.5 % — AB (ref 39.0–52.0)
HEMOGLOBIN: 11.5 g/dL — AB (ref 13.0–17.0)
MCH: 31 pg (ref 26.0–34.0)
MCHC: 36.5 g/dL — AB (ref 30.0–36.0)
MCV: 84.9 fL (ref 78.0–100.0)
PLATELETS: 264 10*3/uL (ref 150–400)
RBC: 3.71 MIL/uL — ABNORMAL LOW (ref 4.22–5.81)
RDW: 12.3 % (ref 11.5–15.5)
WBC: 9 10*3/uL (ref 4.0–10.5)

## 2016-04-24 LAB — BASIC METABOLIC PANEL
ANION GAP: 5 (ref 5–15)
BUN: 12 mg/dL (ref 6–20)
CHLORIDE: 104 mmol/L (ref 101–111)
CO2: 27 mmol/L (ref 22–32)
Calcium: 8.7 mg/dL — ABNORMAL LOW (ref 8.9–10.3)
Creatinine, Ser: 0.93 mg/dL (ref 0.61–1.24)
GFR calc Af Amer: >60 mL/min (ref >60)
GLUCOSE: 105 mg/dL — AB (ref 65–99)
POTASSIUM: 3.8 mmol/L (ref 3.5–5.1)
Sodium: 136 mmol/L (ref 135–145)

## 2016-04-24 MED ORDER — SENNA 8.6 MG PO TABS
2.0000 | ORAL_TABLET | Freq: Every day | ORAL | Status: DC | PRN
  Administered 2016-04-24: 17.2 mg via ORAL
  Filled 2016-04-24: qty 2

## 2016-04-24 MED ORDER — CIPROFLOXACIN HCL 500 MG PO TABS: 500.0000 mg | ORAL_TABLET | Freq: Two times a day (BID) | ORAL | 0 refills | Status: DC

## 2016-04-24 MED ORDER — METRONIDAZOLE 500 MG PO TABS: 500.0000 mg | ORAL_TABLET | Freq: Three times a day (TID) | ORAL | 0 refills | Status: DC

## 2016-04-24 NOTE — Discharge Summary (Signed)
Physician Discharge Summary  Joshua Baldwin ZOX:[id number] DOB: April 14, 1961 DOA: 04/20/2016  PCP: Jacki Cones, MD  Admit date: 04/20/2016 Discharge date: 04/24/2016  Admitted From: home Disposition:  Home  Recommendations for Outpatient Follow-up:  1. Follow up with PCP in 1-2 weeks 2. Please obtain BMP/CBC in one week 3. Follow-up with GI in 4 weeks we'll need screening, be as an outpatient.  Home Health: No Equipment/Devices:none  Discharge Condition:stable CODE STATUS:full Diet recommendation: Heart Healthy Brief/Interim Summary: 55 year old with past medical history of back pain status post lumbar function comes in complaining of nausea and vomiting 3 days prior to admission.  Discharge Diagnoses:  AKI: - Baseline creatinine less than 1 last checked on 3 .2017, on admission it was 2.0.Likely prerenal. - There is resolved with IV fluid hydration.  Leukocytosis with Nausea and vomiting, question due to early diverticulitis: After 24 hours in the hospital without antibiotics his leukocytosis continued to rise he then started complaining of abdominal pain in the left lower quadrant. He relates he had a colonoscopy at the age of 38 as a screening colonoscopy showed a polyp and diverticulosis as per patient.  CT scan of the abdomen and pelvis to show free fluid, and multiple cysts (which were present on CT scan of 02/24/2013 with similar size.) LFTs are within normal limits. -He was started empirically on IV ciprofloxacin and Flagyl by the next day he felt much better was tolerating his diet. His leukocytosis resolved. He was changed to oral Cipro progress and Flagyl which she will continue for 10 additional days as an outpatient.  Hypokalemia: Repleted orally. Resolved.  Elevated anion gap: Likely secondary to dehydration resolved with IV fluid hydration.  Back pain status post lumbar fusion: Continue when necessary hydrocodone he relates his back pain is  unchanged.  Incidental Liver cyst: Seen on CT minimal increase in size from previous in 2014 will need further workup as an outpatient.  Elevated total protein: Resolved likely due to dehydration.  Hypercalcemia: Resolved, with IV fluid hydration   Discharge Instructions  Discharge Instructions    Diet - low sodium heart healthy    Complete by:  As directed    Increase activity slowly    Complete by:  As directed        Medication List    STOP taking these medications   azithromycin 250 MG tablet Commonly known as:  ZITHROMAX Z-PAK   doxycycline 50 MG capsule Commonly known as:  VIBRAMYCIN     TAKE these medications   albuterol 108 (90 Base) MCG/ACT inhaler Commonly known as:  PROVENTIL HFA;VENTOLIN HFA Inhale 1-2 puffs into the lungs every 6 (six) hours as needed for wheezing or shortness of breath.   chlorpheniramine-HYDROcodone 10-8 MG/5ML Lqcr Commonly known as:  TUSSIONEX PENNKINETIC ER Take 5 mLs by mouth every 12 (twelve) hours as needed for cough (Cough).   ciprofloxacin 500 MG tablet Commonly known as:  CIPRO Take 1 tablet (500 mg total) by mouth 2 (two) times daily.   cyclobenzaprine 10 MG tablet Commonly known as:  FLEXERIL Take 1 tablet (10 mg total) by mouth 3 (three) times daily as needed for muscle spasms.   HYDROcodone-acetaminophen 5-325 MG tablet Commonly known as:  NORCO/VICODIN Take 1 tablet by mouth every 6 (six) hours as needed for moderate pain or severe pain.   metroNIDAZOLE 500 MG tablet Commonly known as:  FLAGYL Take 1 tablet (500 mg total) by mouth every 8 (eight) hours.   pseudoephedrine 60 MG tablet Commonly known as:  SUDAFED Take 1 tablet (60 mg total) by mouth every 4 (four) hours as needed for congestion.       Allergies  Allergen Reactions  . Gadolinium Derivatives Shortness Of Breath and Nausea And Vomiting  . Mrv [Mixed Respiratory Bacterial Vaccine]     Consultations:   Procedures/Studies: Ct Abdomen  Pelvis W Contrast  Result Date: 04/22/2016 CLINICAL DATA:  Left lower quadrant pain.  Nausea and vomiting. EXAM: CT ABDOMEN AND PELVIS WITH CONTRAST TECHNIQUE: Multidetector CT imaging of the abdomen and pelvis was performed using the standard protocol following bolus administration of intravenous contrast. CONTRAST:  ISOVUE-300 IOPAMIDOL (ISOVUE-300) INJECTION 61% COMPARISON:  04/20/2016 noncontrast CT of the abdomen and pelvis FINDINGS: Lower chest: Dependent subsegmental atelectasis in both lower lobes. No pleural effusion. Hepatobiliary: Innumerable low-density lesions are again seen throughout the liver, with the largest measuring 5.0 cm in the left hepatic lobe and unchanged from the recent prior CT, most compatible with cysts. Unremarkable gallbladder. No biliary dilatation. Pancreas: Unremarkable. Spleen: Unremarkable. Adrenals/Urinary Tract: Unremarkable appearance of the adrenal glands, kidneys, and bladder. No renal calculi or hydronephrosis identified. Stomach/Bowel: There is no evidence of bowel obstruction. Oral contrast is present in loops of distal small bowel as well as in the colon to the level of the descending colon. Assessment for bowel wall thickening/inflammation is limited as the sigmoid colon and majority of small bowel loops are decompressed, and there is a paucity of peritoneal fat. The appendix is opacified with contrast material and is unremarkable. Vascular/Lymphatic: Minimal abdominal aortic atherosclerosis without aneurysm. No enlarged lymph nodes identified. Reproductive: Unremarkable prostate. Other: New small volume free fluid in the pelvis. The no abdominal wall hernia. Musculoskeletal: Severe right and mild-to-moderate left hip joint arthropathic changes again noted. Prior L2-L5 posterior decompression and fusion. Severe disc degeneration on the left at L1-2. IMPRESSION: 1. New small volume pelvic free fluid, abnormal but of uncertain etiology. Limited assessment for  bowel inflammation due to underdistention and paucity of intraperitoneal fat. 2. Unchanged numerous cystic liver lesions. 3. Minimal aortic atherosclerosis. Electronically Signed   By: Sebastian Ache M.D.   On: 04/22/2016 22:43   Dg Chest Port 1 View  Result Date: 04/21/2016 CLINICAL DATA:  Weight loss and vomiting. EXAM: PORTABLE CHEST 1 VIEW COMPARISON:  Radiograph 11/02/2015. Included lung bases from CT abdomen/ pelvis 2 hours prior. FINDINGS: The cardiomediastinal contours are normal. The lungs are clear. Previous right lower lobe consolidation has resolved. Pulmonary vasculature is normal. No consolidation, pleural effusion, or pneumothorax. No acute osseous abnormalities are seen. IMPRESSION: No active disease. Electronically Signed   By: Rubye Oaks M.D.   On: 04/21/2016 03:24   Ct Renal Stone Study  Result Date: 04/21/2016 CLINICAL DATA:  55 y/o M; constant worsening nausea and vomiting beginning 2 days ago, possibly related to food poisoning. Generalized abdominal cramping, left lower back pain, and difficulty urinating. EXAM: CT ABDOMEN AND PELVIS WITHOUT CONTRAST TECHNIQUE: Multidetector CT imaging of the abdomen and pelvis was performed following the standard protocol without IV contrast. COMPARISON:  02/24/2013 CT of abdomen and pelvis. EEN fall off bed all FINDINGS: Lower chest: No acute abnormality. Hepatobiliary: Numerous cystic liver lesions are increased in size in comparison with the prior CT for example the lesion within segment 2 measures 48 mm, previously 43 mm and the cyst within the caudate extent exophytically into the mid abdomen. No biliary ductal dilatation. Normal gallbladder. Pancreas: Unremarkable. No pancreatic ductal dilatation or surrounding inflammatory changes. Spleen: Normal in size without focal abnormality.  Adrenals/Urinary Tract: Adrenal glands are unremarkable. Kidneys are normal, without renal calculi, focal lesion, or hydronephrosis. Bladder is unremarkable.  Foley catheter in situ. Stable pelvic phleboliths. Stomach/Bowel: Stomach is within normal limits. No evidence of bowel wall thickening, distention, or inflammatory changes. Appendix is not visualized. Vascular/Lymphatic: Minimal aortic atherosclerosis. No enlarged abdominal or pelvic lymph nodes. Reproductive: Prostate is unremarkable. Other: No abdominal wall hernia or abnormality. No abdominopelvic ascites. Musculoskeletal: Severe degenerative changes of the right hip joint with loss of joint space, periarticular osteophytes and fibrocystic degeneration. Mild degenerative changes of the left hip joint. L2 through L5 posterior instrumented fusion, laminectomy, and interbody fusion with severe degenerative changes. L1-2 adjacent segment disease is new from prior CT. IMPRESSION: 1. No acute finding as explanation of abdominal pain. No obstructive uropathy or urinary stone disease. 2. Numerous cystic lesions within the liver are again increased in size from prior CT suggesting a progressive disorder such as polycystic liver disease or Caroli disease. 3. L2 through L5 posterior instrumented fusion. 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 Electronically Signed   By: Mitzi Hansen M.D.   On: 04/21/2016 00:02    (Echo, Carotid, EGD, Colonoscopy, ERCP)    Subjective: Complains feels well.  Discharge Exam: Vitals:   04/23/16 1453 04/23/16 2116  BP: 139/87 (!) 133/92  Pulse: 66 62  Resp: 18 17  Temp: 98.2 F (36.8 C) 98.9 F (37.2 C)   Vitals:   04/22/16 2245 04/23/16 0641 04/23/16 1453 04/23/16 2116  BP: (!) 141/89 127/88 139/87 (!) 133/92  Pulse: (!) 59 (!) 51 66 62  Resp: 16 16 18 17   Temp: 98 F (36.7 C) 98 F (36.7 C) 98.2 F (36.8 C) 98.9 F (37.2 C)  TempSrc: Oral Oral Oral Oral  SpO2: 100% 100% 100% 100%  Weight:      Height:        General: Pt is alert, awake, not in acute distress Cardiovascular: RRR, S1/S2 +, no rubs, no gallops Respiratory: CTA bilaterally, no  wheezing, no rhonchi Abdominal: Soft, NT, ND, bowel sounds + Extremities: no edema, no cyanosis    The results of significant diagnostics from this hospitalization (including imaging, microbiology, ancillary and laboratory) are listed below for reference.     Microbiology: Recent Results (from the past 240 hour(s))  Urine culture     Status: None   Collection Time: 04/20/16 10:13 PM  Result Value Ref Range Status   Specimen Description URINE, RANDOM  Final   Special Requests NONE  Final   Culture NO GROWTH Performed at [facility]   Final   Report Status 04/22/2016 FINAL  Final  MRSA PCR Screening     Status: Abnormal   Collection Time: 04/21/16  3:14 AM  Result Value Ref Range Status   MRSA by PCR POSITIVE (A) NEGATIVE Final    Comment:        The GeneXpert MRSA Assay (FDA approved for NASAL specimens only), is one component of a comprehensive MRSA colonization surveillance program. It is not intended to diagnose MRSA infection nor to guide or monitor treatment for MRSA infections. RESULT CALLED TO, READ BACK BY AND VERIFIED WITH: L.Jamal Maes [id number] 04/21/16 W.SHEA      Labs: BNP (last 3 results) No results for input(s): BNP in the last 8760 hours. Basic Metabolic Panel:  Recent Labs Lab 04/20/16 1938 04/21/16 0323 04/22/16 0343 04/23/16 0422 04/24/16 0414  NA 134* 138 136 134* 136  K 3.3* 3.2* 3.7 3.4* 3.8  CL 92* 99* 103  102 104  CO2 25 29 26 26 27   GLUCOSE 119* 112* 99 83 105*  BUN 25* 27* 15 13 12   CREATININE 2.07* 1.66* 0.96 0.91 0.93  CALCIUM 10.2 8.8* 8.5* 8.5* 8.7*   Liver Function Tests:  Recent Labs Lab 04/20/16 1938 04/21/16 0323  AST 43* 37  ALT 40 35  ALKPHOS 88 73  BILITOT 1.1 1.0  PROT 9.5* 7.5  ALBUMIN 5.0 4.0    Recent Labs Lab 04/20/16 1938  LIPASE 21   No results for input(s): AMMONIA in the last 168 hours. CBC:  Recent Labs Lab 04/20/16 1938 04/21/16 1104 04/23/16 0422 04/24/16 0414  WBC 16.3* 17.4* 9.3  9.0  HGB 16.4 13.4 11.4* 11.5*  HCT 44.8 37.4* 31.5* 31.5*  MCV 85.5 86.6 85.1 84.9  PLT 345 287 238 264   Cardiac Enzymes: No results for input(s): CKTOTAL, CKMB, CKMBINDEX, TROPONINI in the last 168 hours. BNP: Invalid input(s): POCBNP CBG: No results for input(s): GLUCAP in the last 168 hours. D-Dimer No results for input(s): DDIMER in the last 72 hours. Hgb A1c No results for input(s): HGBA1C in the last 72 hours. Lipid Profile No results for input(s): CHOL, HDL, LDLCALC, TRIG, CHOLHDL, LDLDIRECT in the last 72 hours. Thyroid function studies No results for input(s): TSH, T4TOTAL, T3FREE, THYROIDAB in the last 72 hours.  Invalid input(s): FREET3 Anemia work up No results for input(s): VITAMINB12, FOLATE, FERRITIN, TIBC, IRON, RETICCTPCT in the last 72 hours. Urinalysis    Component Value Date/Time   COLORURINE ORANGE (A) 04/20/2016 2213   APPEARANCEUR CLOUDY (A) 04/20/2016 2213   LABSPEC 1.027 04/20/2016 2213   PHURINE 5.0 04/20/2016 2213   GLUCOSEU NEGATIVE 04/20/2016 2213   HGBUR NEGATIVE 04/20/2016 2213   BILIRUBINUR SMALL (A) 04/20/2016 2213   KETONESUR NEGATIVE 04/20/2016 2213   PROTEINUR 100 (A) 04/20/2016 2213   UROBILINOGEN 1.0 05/17/2013 0217   NITRITE NEGATIVE 04/20/2016 2213   LEUKOCYTESUR SMALL (A) 04/20/2016 2213   Sepsis Labs Invalid input(s): PROCALCITONIN,  WBC,  LACTICIDVEN Microbiology Recent Results (from the past 240 hour(s))  Urine culture     Status: None   Collection Time: 04/20/16 10:13 PM  Result Value Ref Range Status   Specimen Description URINE, RANDOM  Final   Special Requests NONE  Final   Culture NO GROWTH [facility]   Final   Report Status 04/22/2016 FINAL  Final  MRSA PCR Screening     Status: Abnormal   Collection Time: 04/21/16  3:14 AM  Result Value Ref Range Status   MRSA by PCR POSITIVE (A) NEGATIVE Final    Comment:        The GeneXpert MRSA Assay (FDA approved for NASAL specimens only), is  one component of a comprehensive MRSA colonization surveillance program. It is not intended to diagnose MRSA infection nor to guide or monitor treatment for MRSA infections. RESULT CALLED TO, READ BACK BY AND VERIFIED WITH: L.Jamal MaesBANG,RN [id number] 04/21/16 W.SHEA      Time coordinating discharge: Over 30 minutes  SIGNED:   Marinda ElkFELIZ ORTIZ, ABRAHAM, MD  [facility] 04/24/2016, 10:19 AM Pager   If 7PM-7AM, please contact night-coverage [email or web address] Password [email or web address]

## 2016-04-25 NOTE — Progress Notes (Signed)
Patient discharged to home via wheelchair, discharge instructions reviewed with patient who verbalized understanding. New RX's given to patient. 

## 2016-06-09 ENCOUNTER — Emergency Department (HOSPITAL_COMMUNITY)
Admission: EM | Admit: 2016-06-09 | Discharge: 2016-06-09 | Disposition: A | Discharge status: Home / Self Care | Payer: Medicaid Other | Attending: Emergency Medicine | Admitting: Emergency Medicine

## 2016-06-09 ENCOUNTER — Encounter (HOSPITAL_COMMUNITY): Payer: Self-pay | Admitting: Emergency Medicine

## 2016-06-09 DIAGNOSIS — Z79899 Other long term (current) drug therapy: Secondary | ICD-10-CM | POA: Diagnosis not present

## 2016-06-09 DIAGNOSIS — Z791 Long term (current) use of non-steroidal anti-inflammatories (NSAID): Secondary | ICD-10-CM | POA: Diagnosis not present

## 2016-06-09 DIAGNOSIS — R197 Diarrhea, unspecified: Secondary | ICD-10-CM | POA: Diagnosis not present

## 2016-06-09 DIAGNOSIS — Z87891 Personal history of nicotine dependence: Secondary | ICD-10-CM | POA: Insufficient documentation

## 2016-06-09 DIAGNOSIS — R112 Nausea with vomiting, unspecified: Secondary | ICD-10-CM | POA: Diagnosis not present

## 2016-06-09 DIAGNOSIS — R1084 Generalized abdominal pain: Secondary | ICD-10-CM | POA: Diagnosis present

## 2016-06-09 DIAGNOSIS — R1032 Left lower quadrant pain: Secondary | ICD-10-CM | POA: Diagnosis not present

## 2016-06-09 LAB — URINALYSIS, ROUTINE W REFLEX MICROSCOPIC
GLUCOSE, UA: NEGATIVE mg/dL
KETONES UR: NEGATIVE mg/dL
NITRITE: POSITIVE — AB
PH: 5.5 (ref 5.0–8.0)
PROTEIN: 100 mg/dL — AB
Specific Gravity, Urine: 1.026 (ref 1.005–1.030)

## 2016-06-09 LAB — CBC
HEMATOCRIT: 42.3 % (ref 39.0–52.0)
Hemoglobin: 15.6 g/dL (ref 13.0–17.0)
MCH: 30.8 pg (ref 26.0–34.0)
MCHC: 36.9 g/dL — ABNORMAL HIGH (ref 30.0–36.0)
MCV: 83.4 fL (ref 78.0–100.0)
PLATELETS: 408 10*3/uL — AB (ref 150–400)
RBC: 5.07 MIL/uL (ref 4.22–5.81)
RDW: 13.1 % (ref 11.5–15.5)
WBC: 15.9 10*3/uL — AB (ref 4.0–10.5)

## 2016-06-09 LAB — COMPREHENSIVE METABOLIC PANEL
ALT: 37 U/L (ref 17–63)
ANION GAP: 15 (ref 5–15)
AST: 37 U/L (ref 15–41)
Albumin: 4.9 g/dL (ref 3.5–5.0)
Alkaline Phosphatase: 83 U/L (ref 38–126)
BILIRUBIN TOTAL: 1 mg/dL (ref 0.3–1.2)
BUN: 25 mg/dL — ABNORMAL HIGH (ref 6–20)
CHLORIDE: 96 mmol/L — AB (ref 101–111)
CO2: 25 mmol/L (ref 22–32)
Calcium: 10.7 mg/dL — ABNORMAL HIGH (ref 8.9–10.3)
Creatinine, Ser: 1.91 mg/dL — ABNORMAL HIGH (ref 0.61–1.24)
GFR, EST AFRICAN AMERICAN: 44 mL/min — AB (ref >60)
GFR, EST NON AFRICAN AMERICAN: 38 mL/min — AB (ref >60)
Glucose, Bld: 139 mg/dL — ABNORMAL HIGH (ref 65–99)
POTASSIUM: 3.4 mmol/L — AB (ref 3.5–5.1)
Sodium: 136 mmol/L (ref 135–145)
TOTAL PROTEIN: 9.4 g/dL — AB (ref 6.5–8.1)

## 2016-06-09 LAB — URINE MICROSCOPIC-ADD ON

## 2016-06-09 LAB — LIPASE, BLOOD: LIPASE: 15 U/L (ref 11–51)

## 2016-06-09 MED ORDER — HYDROCODONE-ACETAMINOPHEN 5-325 MG PO TABS: 2.0000 | ORAL_TABLET | ORAL | 0 refills | Status: DC | PRN

## 2016-06-09 MED ORDER — CIPROFLOXACIN HCL 500 MG PO TABS
500.0000 mg | ORAL_TABLET | Freq: Once | ORAL | Status: AC
  Administered 2016-06-09: 500 mg via ORAL
  Filled 2016-06-09: qty 1

## 2016-06-09 MED ORDER — DICYCLOMINE HCL 10 MG PO CAPS
10.0000 mg | ORAL_CAPSULE | Freq: Once | ORAL | Status: AC
  Administered 2016-06-09: 10 mg via ORAL
  Filled 2016-06-09: qty 1

## 2016-06-09 MED ORDER — ONDANSETRON 8 MG PO TBDP
8.0000 mg | ORAL_TABLET | Freq: Once | ORAL | Status: AC
  Administered 2016-06-09: 8 mg via ORAL
  Filled 2016-06-09: qty 1

## 2016-06-09 MED ORDER — CIPROFLOXACIN HCL 500 MG PO TABS: 500.0000 mg | ORAL_TABLET | Freq: Two times a day (BID) | ORAL | 0 refills | Status: DC

## 2016-06-09 MED ORDER — METRONIDAZOLE 500 MG PO TABS
500.0000 mg | ORAL_TABLET | Freq: Once | ORAL | Status: AC
  Administered 2016-06-09: 500 mg via ORAL
  Filled 2016-06-09: qty 1

## 2016-06-09 MED ORDER — METRONIDAZOLE 500 MG PO TABS: 500.0000 mg | ORAL_TABLET | Freq: Two times a day (BID) | ORAL | 0 refills | Status: DC

## 2016-06-09 MED ORDER — KETOROLAC TROMETHAMINE 30 MG/ML IJ SOLN
30.0000 mg | Freq: Once | INTRAMUSCULAR | Status: AC
  Administered 2016-06-09: 30 mg via INTRAVENOUS
  Filled 2016-06-09: qty 1

## 2016-06-09 MED ORDER — SODIUM CHLORIDE 0.9 % IV BOLUS (SEPSIS)
1000.0000 mL | Freq: Once | INTRAVENOUS | Status: AC
  Administered 2016-06-09: 1000 mL via INTRAVENOUS

## 2016-06-09 NOTE — ED Notes (Signed)
Obtained urine, sent to lab.

## 2016-06-09 NOTE — ED Notes (Signed)
RN is starting an IV and collecting blood at this time

## 2016-06-09 NOTE — ED Notes (Signed)
Pt is aware that a urine sample is needed and has a urinal at the bedside 

## 2016-06-09 NOTE — ED Triage Notes (Signed)
Per pt, states abdominal pain since yesterday-vomiting-was seen last month for the dame symptoms

## 2016-06-09 NOTE — ED Notes (Signed)
Spoke to patient regarding the need for urine.  He is aware and stated that he will try again but, just urinated prior to arrival.

## 2016-06-09 NOTE — ED Provider Notes (Signed)
[facility]-EMERGENCY DEPT Provider Note   CSN: [id number] Arrival date & time: 06/09/16  1623     History   Chief Complaint Chief Complaint  Patient presents with  . Abdominal Pain    HPI Joshua Baldwin is a 55 y.o. male.  HPI here for evaluation of nausea, vomiting and diarrhea. Patient reports symptoms started yesterday afternoon. He reports 2 episodes of loose stool, nonbloody or overtly dark. Several episodes of emesis, nonbloody and nonbilious. He reports diffuse abdominal cramping. He reports his wife gave him an antibiotic, but he is unsure what this was. It did not seem to help his problem. He reports having this problem in September. He denies any recent travel, surgeries, other antibiotic use. No fevers or chills, numbness or weakness, chest pain or shortness of breath.  Past Medical History:  Diagnosis Date  . Arthritis   . Back pain   . 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 Early diverticulitis of the colon 04/23/2016  . AKI (acute kidney injury) (HCC) 04/21/2016  . Leukocytosis 04/21/2016  . Nausea and vomiting 04/21/2016  . Hypokalemia 04/21/2016  . Liver cysts 04/21/2016  . Acute renal failure (HCC) 04/21/2016  . 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 BACK SURGERY  04       Home Medications    Prior to Admission medications   Medication Sig Start Date End Date Taking? Authorizing Provider  albuterol (PROVENTIL HFA;VENTOLIN HFA) 108 (90 BASE) MCG/ACT inhaler Inhale 1-2 puffs into the lungs every 6 (six) hours as needed for wheezing or shortness of breath. 10/21/14  Yes Joycie PeekBenjamin Riyana Biel, PA-C  HYDROcodone-acetaminophen (NORCO/VICODIN) 5-325 MG tablet Take 1 tablet by mouth every 6 (six) hours as needed for moderate pain or severe pain.   Yes Historical Provider, MD    tiZANidine (ZANAFLEX) 4 MG tablet Take 4 mg by mouth 2 (two) times daily as needed for muscle spasms. 06/01/16  Yes Historical Provider, MD  traMADol (ULTRAM) 50 MG tablet Take 50-100 mg by mouth 2 (two) times daily as needed for pain. 05/24/16  Yes Historical Provider, MD  ciprofloxacin (CIPRO) 500 MG tablet Take 1 tablet (500 mg total) by mouth 2 (two) times daily. Patient not taking: Reported on 06/09/2016 04/24/16   Marinda ElkAbraham Feliz Ortiz, MD  ciprofloxacin (CIPRO) 500 MG tablet Take 1 tablet (500 mg total) by mouth 2 (two) times daily. 06/09/16   Joycie PeekBenjamin Digna Countess, PA-C  HYDROcodone-acetaminophen (NORCO/VICODIN) 5-325 MG tablet Take 2 tablets by mouth every 4 (four) hours as needed. 06/09/16   Joycie PeekBenjamin Amelianna Meller, PA-C  metroNIDAZOLE (FLAGYL) 500 MG tablet Take 1 tablet (500 mg total) by mouth every 8 (eight) hours. Patient not taking: Reported on 06/09/2016 04/24/16   Marinda ElkAbraham Feliz Ortiz, MD  metroNIDAZOLE (FLAGYL) 500 MG tablet Take 1 tablet (500 mg total) by mouth 2 (two) times daily. 06/09/16   Joycie PeekBenjamin Aveer Bartow, PA-C    Family History No family history on file.  Social History Social History  Substance Use Topics  . Smoking status: Former Smoker    Packs/day: 0.25    Years: 7.00    Types: Cigarettes  . Smokeless tobacco: Never Used  . Alcohol use No     Allergies   Gadolinium derivatives and Mrv [mixed respiratory bacterial vaccine]   Review of Systems Review of Systems  A 10 point review of systems was completed and was negative except for pertinent positives and negatives as mentioned in the history of present illness    Physical Exam Updated Vital Signs BP 145/95 (BP Location: Left Arm)   Pulse 75   Temp 98.2 F (36.8 C) (Oral)   Resp 16   SpO2 100%   Physical Exam  Constitutional: He appears well-developed. No distress.  Awake, alert and nontoxic in appearance  HENT:  Head: Normocephalic and atraumatic.  Right Ear: External ear normal.  Left Ear: External ear normal.   Mouth/Throat: Oropharynx is clear and moist.  Eyes: Conjunctivae and EOM are normal. Pupils are equal, round, and reactive to light.  Neck: Normal range of motion. No JVD present.  Cardiovascular: Normal rate, regular rhythm and normal heart sounds.   Pulmonary/Chest: Effort normal and breath sounds normal. No stridor.  Abdominal: Soft.  Diffuse abdominal tenderness with palpation, worse in LLQ and suprapubic. No peritoneal signs. No distention. No rebound or guarding  Musculoskeletal: Normal range of motion.  Neurological:  Awake, alert, cooperative and aware of situation; motor strength bilaterally; sensation normal to light touch bilaterally; no facial asymmetry; tongue midline; major cranial nerves appear intact;  baseline gait without new ataxia.  Skin: No rash noted. He is not diaphoretic.  Psychiatric: He has a normal mood and affect. His behavior is normal. Thought content normal.  Nursing note and vitals reviewed.    ED Treatments / Results  Labs (all labs ordered are listed, but only abnormal results are displayed) Labs Reviewed  COMPREHENSIVE METABOLIC PANEL - Abnormal; Notable for the following:       Result Value   Potassium 3.4 (*)    Chloride 96 (*)    Glucose, Bld 139 (*)    BUN 25 (*)    Creatinine, Ser 1.91 (*)    Calcium 10.7 (*)    Total Protein 9.4 (*)    GFR calc non Af Amer 38 (*)    GFR calc Af Amer 44 (*)    All other components within normal limits  CBC - Abnormal; Notable for the following:    WBC 15.9 (*)    MCHC 36.9 (*)    Platelets 408 (*)    All other components within normal limits  URINALYSIS, ROUTINE W REFLEX MICROSCOPIC (NOT AT [facility]) - Abnormal; Notable for the following:    Color, Urine RED (*)    APPearance TURBID (*)    Hgb urine dipstick TRACE (*)    Bilirubin Urine MODERATE (*)    Protein, ur 100 (*)    Nitrite POSITIVE (*)    Leukocytes, UA SMALL (*)    All other components within normal limits  URINE MICROSCOPIC-ADD ON -  Abnormal; Notable for the following:    Squamous Epithelial / LPF 0-5 (*)    Bacteria, UA FEW (*)    All other components within normal limits  LIPASE, BLOOD    EKG  EKG Interpretation None       Radiology No results found.  Procedures Procedures (including critical care time)  Medications Ordered in ED Medications  ondansetron (ZOFRAN-ODT) disintegrating tablet 8 mg (8 mg Oral Given 06/09/16 1727)  ketorolac (TORADOL) 30 MG/ML injection 30 mg (30 mg Intravenous Given 06/09/16 1807)  dicyclomine (BENTYL) capsule 10 mg (10 mg Oral Given 06/09/16 1807)  sodium chloride 0.9 % bolus 1,000 mL (0 mLs Intravenous Stopped 06/09/16 2008)  ciprofloxacin (CIPRO) tablet 500 mg (500 mg Oral Given 06/09/16 1841)  metroNIDAZOLE (  FLAGYL) tablet 500 mg (500 mg Oral Given 06/09/16 1841)     Initial Impression / Assessment and Plan / ED Course  I have reviewed the triage vital signs and the nursing notes.  Pertinent labs & imaging results that were available during my care of the patient were reviewed by me and considered in my medical decision making (see chart for details).  Clinical Course    Patient with afebrile nausea, vomiting, diarrhea and abdominal cramping x 2 days.  He does have tenderness in left lower quadrant as well as suprapubic region. Upon review of old notes, he was seen for this problem in the middle of September his symptoms were thought to be due to early diverticulitis. He was started on Cipro and Flagyl which improved his symptoms greatly. Patient does have leukocytosis of 15.9, increased creatinine of 1.9 up from baseline of 0.9. I suspect this is secondary to dehydration. Patient clinically has symptoms consistent with diverticulitis. Patient declines CT scan which I think is reasonable. Discussed risks including perforation, abscess formation, worsening symptoms or other pathology . We'll treat empirically with Cipro and Flagyl for 10 days-first dose given in ED. Short  course pain meds. IV fluids in ED. Evidence of possible UTI, but abx should cover--culture obtained.  Follow up with PCP next week. Discussed strict return precautions as well as aggressive oral rehydration. He verbalizes understanding, and agrees with plan and subsequent discharge. Prior to patient discharge, I discussed and reviewed this case with Dr.James      Final Clinical Impressions(s) / ED Diagnoses   Final diagnoses:  Left lower quadrant pain    New Prescriptions New Prescriptions   CIPROFLOXACIN (CIPRO) 500 MG TABLET    Take 1 tablet (500 mg total) by mouth 2 (two) times daily.   HYDROCODONE-ACETAMINOPHEN (NORCO/VICODIN) 5-325 MG TABLET    Take 2 tablets by mouth every 4 (four) hours as needed.   METRONIDAZOLE (FLAGYL) 500 MG TABLET    Take 1 tablet (500 mg total) by mouth 2 (two) times daily.     Joycie PeekBenjamin Perfect [place], PA-C 06/09/16 2017    Rolland PorterMark James, MD 06/19/16 [phone]

## 2016-06-09 NOTE — Discharge Instructions (Signed)
Your symptoms are likely due to diverticulitis. He will be treated for this problem with antibiotics. Please take all of your antibiotics as prescribed, do not save or share them. He may use your pain medicine as we discussed and as prescribed. He should stay well hydrated and drink at least 8, 8 ounce glasses of water per day. Please follow-up with your doctor in the next 3-4 days for reevaluation. Return to ED for any new or worsening symptoms as we discussed.

## 2016-06-10 ENCOUNTER — Telehealth (HOSPITAL_BASED_OUTPATIENT_CLINIC_OR_DEPARTMENT_OTHER): Payer: Self-pay | Admitting: *Deleted

## 2016-06-10 NOTE — ED Provider Notes (Signed)
Spoke with Cala BradfordKimberly, RN who states patient's pharmacy called and told her that the patient is already taking Zanaflex and the Cipro he was prescribed in the ED yesterday is not compatible. One of the medications has to be held. I advised that since the Cipro is being used for treatment of an acute infection, it would be advisable for the patient to hold the Zanaflex and continue the Cipro.   Anselm PancoastShawn C Joy, PA-C 06/10/16 1857    Gerhard Munchobert Lockwood, MD 06/10/16 1939

## 2016-06-11 LAB — URINE CULTURE: Culture: NO GROWTH

## 2016-06-30 ENCOUNTER — Ambulatory Visit: Payer: Self-pay | Admitting: Physician Assistant

## 2016-06-30 NOTE — H&P (Signed)
TOTAL HIP ADMISSION H&P   Patient is admitted for right total hip arthroplasty.  Subjective:  Chief Complaint: right hip pain  HPI: Joshua Baldwin, 55 y.o. male, has a history of pain and functional disability in the right hip(s) due to arthritis and patient has failed non-surgical conservative treatments for greater than 12 weeks to include NSAID's and/or analgesics and activity modification.  Onset of symptoms was gradual starting 5 years ago with gradually worsening course since that time.The patient noted no past surgery on the right hip(s).  Patient currently rates pain in the right hip at 8 out of 10 with activity. Patient has night pain, worsening of pain with activity and weight bearing, trendelenberg gait, pain that interfers with activities of daily living and pain with passive range of motion. Patient has evidence of periarticular osteophytes and joint space narrowing by imaging studies. This condition presents safety issues increasing the risk of falls.  Patient Active Problem List   Diagnosis Date Noted  . Early diverticulitis of the colon 04/23/2016  . AKI (acute kidney injury) (HCC) 04/21/2016  . Leukocytosis 04/21/2016  . Nausea and vomiting 04/21/2016  . Hypokalemia 04/21/2016  . Liver cysts 04/21/2016  . Acute renal failure (HCC) 04/21/2016  . 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 Arthritis   . Back pain   . Lung abnormality    ?cyst of lung recently seen on back film told by dr Gerlene Feekritzer    Past Surgical History:  Procedure Laterality Date  . BACK SURGERY  04     (Not in a hospital admission) Allergies  Allergen Reactions  . Gadolinium Derivatives Shortness Of Breath and Nausea And Vomiting  . Mrv [Mixed Respiratory Bacterial Vaccine]     Social History  Substance Use Topics  . Smoking status: Former Smoker    Packs/day: 0.25    Years: 7.00    Types: Cigarettes  . Smokeless  tobacco: Never Used  . Alcohol use No    No family history on file.   Review of Systems  Musculoskeletal: Positive for back pain and joint pain.  All other systems reviewed and are negative.   Objective:  Physical Exam  Constitutional: He is oriented to person, place, and time. He appears well-developed and well-nourished. No distress.  HENT:  Head: Normocephalic and atraumatic.  Nose: Nose normal.  Eyes: Conjunctivae and EOM are normal. Pupils are equal, round, and reactive to light.  Neck: Normal range of motion. Neck supple.  Cardiovascular: Normal rate, regular rhythm, normal heart sounds and intact distal pulses.   Respiratory: Effort normal and breath sounds normal. No respiratory distress. He has no wheezes.  GI: Soft. Bowel sounds are normal. He exhibits no distension. There is no tenderness.  Musculoskeletal:       Right hip: He exhibits decreased range of motion, decreased strength and tenderness.  Lymphadenopathy:    He has no cervical adenopathy.  Neurological: He is alert and oriented to person, place, and time. No cranial nerve deficit.  Skin: Skin is warm and dry. No rash noted. No erythema.  Psychiatric: He has a normal mood and affect. His behavior is normal.    Vital signs in last 24 hours: @VSRANGES @  Labs:   Estimated body mass index is 19.42 kg/m as calculated from the following:   Height as of 04/21/16: 5\' 7"  (1.702 m).   Weight as of 04/21/16: 56.2 kg (124 lb).   Imaging Review Plain radiographs  demonstrate severe degenerative joint disease of the right hip(s). The bone quality appears to be good for age and reported activity level.  Assessment/Plan:  End stage arthritis, right hip  The patient history, physical examination, clinical judgement of the provider and imaging studies are consistent with end stage degenerative joint disease of the right hip(s) and total hip arthroplasty is deemed medically necessary. The treatment options including  medical management, injection therapy, arthroscopy and arthroplasty were discussed at length. The risks and benefits of total hip arthroplasty were presented and reviewed. The risks due to aseptic loosening, infection, stiffness, dislocation/subluxation,  thromboembolic complications and other imponderables were discussed.  The patient acknowledged the explanation, but states he has had some recent illness upper respiratory as well as a "diverticulitis flare" and is not wishing to proceed with hip replacement at this time.  We are in agreement he will see his PCP and also need to be evaluated and cleared by GI prior to rescheduling.

## 2016-07-05 ENCOUNTER — Ambulatory Visit: Payer: Self-pay | Admitting: Physician Assistant

## 2016-07-05 ENCOUNTER — Inpatient Hospital Stay (HOSPITAL_COMMUNITY)
Admission: RE | Admit: 2016-07-05 | Discharge: 2016-07-05 | Disposition: A | Discharge status: Home / Self Care | Payer: Medicaid Other | Source: Ambulatory Visit

## 2016-07-09 ENCOUNTER — Encounter: Payer: Self-pay | Admitting: Internal Medicine

## 2016-07-16 ENCOUNTER — Encounter (HOSPITAL_COMMUNITY): Admission: RE | Payer: Self-pay | Source: Ambulatory Visit

## 2016-07-16 ENCOUNTER — Inpatient Hospital Stay (HOSPITAL_COMMUNITY): Admission: RE | Admit: 2016-07-16 | Payer: Medicaid Other | Source: Ambulatory Visit | Admitting: Orthopedic Surgery

## 2016-07-16 SURGERY — ARTHROPLASTY, HIP, TOTAL,POSTERIOR APPROACH
Anesthesia: General | Laterality: Right

## 2016-08-03 ENCOUNTER — Encounter: Payer: Self-pay | Admitting: Internal Medicine

## 2016-08-03 ENCOUNTER — Ambulatory Visit (INDEPENDENT_AMBULATORY_CARE_PROVIDER_SITE_OTHER): Payer: Medicaid Other | Admitting: Internal Medicine

## 2016-08-03 ENCOUNTER — Encounter (INDEPENDENT_AMBULATORY_CARE_PROVIDER_SITE_OTHER): Payer: Self-pay

## 2016-08-03 VITALS — BP 110/70 | HR 83 | Ht 67.0 in | Wt 137.0 lb

## 2016-08-03 DIAGNOSIS — R1310 Dysphagia, unspecified: Secondary | ICD-10-CM | POA: Diagnosis not present

## 2016-08-03 DIAGNOSIS — Z1211 Encounter for screening for malignant neoplasm of colon: Secondary | ICD-10-CM | POA: Diagnosis not present

## 2016-08-03 DIAGNOSIS — R1319 Other dysphagia: Secondary | ICD-10-CM

## 2016-08-03 NOTE — Progress Notes (Signed)
Joshua PockGregory Baldwin 55 y.o. Aug 21, 1960 [id number] referred by: Milford CageEdwin Avbuerre, MD  Assessment & Plan:   Encounter Diagnoses  Name Primary?  . Esophageal dysphagia Yes  . Colon cancer screening     Evaluate dysphagia with EGD and possibly tx w/ dilation  Screening colonoscopy  The risks and benefits as well as alternatives of endoscopic procedure(s) have been discussed and reviewed. All questions answered. The patient agrees to proceed.  WU:[id number]:[facility] PA Dr. Tyson DenseAvbuerre   Subjective:   Chief Complaint: swallowing problems, LLQ pain HPI The patient is here alone with complaints of past GI illnesses and admission in Sept 2017 - had LLQ pain, diarrhea and some nausea/vomiting - dehydration + acute kidney injury. CT abd/pelvis w./o clear cause, clinical; dx GI infection/diverticulitis made and was treated with cipro and flagyl. He has recovered from that but took a while to gain some weight back . No more lower GI sxs but has had some intermittent esophageal solid food dysphagia sxs and some heartburn. On bid omeprazole 20 mg now with help.Will also have regurgitation/reflux and some associated dyspnea at times.He postponed hip surgery to get GI evaluation.   Allergies  Allergen Reactions  . Gadolinium Derivatives Shortness Of Breath and Nausea And Vomiting  . Mrv [Mixed Respiratory Bacterial Vaccine]     Current Outpatient Prescriptions:  .  albuterol (PROVENTIL HFA;VENTOLIN HFA) 108 (90 BASE) MCG/ACT inhaler, Inhale 1-2 puffs into the lungs every 6 (six) hours as needed for wheezing or shortness of breath., Disp: 1 Inhaler, Rfl: 0 .  HYDROcodone-acetaminophen (NORCO/VICODIN) 5-325 MG tablet, Take 2 tablets by mouth every 4 (four) hours as needed., Disp: 6 tablet, Rfl: 0 .  ibuprofen (ADVIL,MOTRIN) 800 MG tablet, Take 400 mg by mouth every 8 (eight) hours as needed for mild pain or moderate pain., Disp: , Rfl:  .  omeprazole (PRILOSEC) 20 MG capsule, Take 20 mg by mouth 2 (two)  times daily., Disp: , Rfl: 2 .  ondansetron (ZOFRAN-ODT) 4 MG disintegrating tablet, Take 4 mg by mouth every 6 (six) hours as needed., Disp: , Rfl: 0 .  sucralfate (CARAFATE) 1 GM/10ML suspension, Take 1 g by mouth as needed., Disp: , Rfl:  .  tiZANidine (ZANAFLEX) 4 MG tablet, Take 4 mg by mouth 2 (two) times daily as needed for muscle spasms., Disp: , Rfl: 2 .  traMADol (ULTRAM) 50 MG tablet, Take 50-100 mg by mouth 2 (two) times daily as needed for pain., Disp: , Rfl: 0  Past Medical History:  Diagnosis Date  . DDD (degenerative disc disease), lumbar   . GERD (gastroesophageal reflux disease)   . Lung abnormality    ?cyst of lung recently seen on back film told by dr Gerlene Feekritzer  . Nicotine dependence   . Osteoarthritis    Hips   Past Surgical History:  Procedure Laterality Date  . BACK SURGERY  04   Social History   Social History  . Marital status: Married    Spouse name: N/A  . Number of children: N/A  . Years of education: N/A   Social History Main Topics  . Smoking status: Current Every Day Smoker    Packs/day: 0.25    Years: 10.00    Types: Cigarettes  . Smokeless tobacco: Never Used  . Alcohol use No  . Drug use: No  . Sexual activity: Not Asked   Other Topics Concern  . None   Social History Narrative   Married   1 son 2 daughters   Not working ?  Disabled   Played college baseball - injuries related to that   1 caffeine/qd   Family History  Problem Relation Age of Onset  . Other Father     died of work related accident        Review of Systems Back pain, right hip and knee pain  Objective:   Physical Exam @BP  110/70   Pulse 83   Ht 5\' 7"  (1.702 m)   Wt 137 lb (62.1 kg)   BMI 21.46 kg/m @  General:  Well-developed, well-nourished and in no acute distress Eyes:  anicteric. ENT:   Mouth and posterior pharynx free of lesions.  Neck:   supple w/o thyromegaly or mass.  Lungs: Clear to auscultation bilaterally. Heart:  S1S2, no rubs, murmurs,  gallops. Abdomen:  soft, non-tender, no hepatosplenomegaly, hernia, or mass and BS+.  Rectal: deferred Lymph:  no cervical or supraclavicular adenopathy. Extremities:   Trace R ankle edema, no cyanosis or clubbing -  Wrap on right knee Skin   no rash. Small sq lesion - quarter sized mid upper back Neuro:  A&O x 3.  Psych:  appropriate mood and  Affect.   Data Reviewed:  PCP notes 07/09/16 CBC 3/17 NL

## 2016-08-03 NOTE — Patient Instructions (Addendum)
You have been scheduled for an endoscopy and colonoscopy. Please follow the written instructions given to you at your visit today. Please pick up your prep supplies at the pharmacy. If you use inhalers (even only as needed), please bring them with you on the day of your procedure.   I appreciate the opportunity to care for you. Carl Gessner, MD, FACG 

## 2016-08-04 ENCOUNTER — Encounter: Payer: Self-pay | Admitting: Internal Medicine

## 2016-08-18 ENCOUNTER — Emergency Department (HOSPITAL_COMMUNITY): Payer: Medicaid Other

## 2016-08-18 ENCOUNTER — Encounter (HOSPITAL_COMMUNITY): Payer: Self-pay | Admitting: Emergency Medicine

## 2016-08-18 ENCOUNTER — Emergency Department (HOSPITAL_COMMUNITY)
Admission: EM | Admit: 2016-08-18 | Discharge: 2016-08-18 | Disposition: A | Discharge status: Home / Self Care | Payer: Medicaid Other | Attending: Physician Assistant | Admitting: Physician Assistant

## 2016-08-18 DIAGNOSIS — R112 Nausea with vomiting, unspecified: Secondary | ICD-10-CM | POA: Diagnosis not present

## 2016-08-18 DIAGNOSIS — R197 Diarrhea, unspecified: Secondary | ICD-10-CM | POA: Diagnosis not present

## 2016-08-18 DIAGNOSIS — R1084 Generalized abdominal pain: Secondary | ICD-10-CM

## 2016-08-18 DIAGNOSIS — F17210 Nicotine dependence, cigarettes, uncomplicated: Secondary | ICD-10-CM | POA: Insufficient documentation

## 2016-08-18 LAB — CBC
HCT: 43.9 % (ref 39.0–52.0)
HEMOGLOBIN: 15.8 g/dL (ref 13.0–17.0)
MCH: 30.6 pg (ref 26.0–34.0)
MCHC: 36 g/dL (ref 30.0–36.0)
MCV: 84.9 fL (ref 78.0–100.0)
PLATELETS: 379 10*3/uL (ref 150–400)
RBC: 5.17 MIL/uL (ref 4.22–5.81)
RDW: 13.5 % (ref 11.5–15.5)
WBC: 22.1 10*3/uL — AB (ref 4.0–10.5)

## 2016-08-18 LAB — URINALYSIS, ROUTINE W REFLEX MICROSCOPIC
BILIRUBIN URINE: NEGATIVE
Glucose, UA: NEGATIVE mg/dL
Ketones, ur: 5 mg/dL — AB
Leukocytes, UA: NEGATIVE
Nitrite: NEGATIVE
PH: 5 (ref 5.0–8.0)
Protein, ur: 100 mg/dL — AB
SPECIFIC GRAVITY, URINE: 1.018 (ref 1.005–1.030)

## 2016-08-18 LAB — COMPREHENSIVE METABOLIC PANEL
ALK PHOS: 68 U/L (ref 38–126)
ALT: 28 U/L (ref 17–63)
AST: 33 U/L (ref 15–41)
Albumin: 5.1 g/dL — ABNORMAL HIGH (ref 3.5–5.0)
Anion gap: 15 (ref 5–15)
BUN: 28 mg/dL — ABNORMAL HIGH (ref 6–20)
CALCIUM: 10.3 mg/dL (ref 8.9–10.3)
CO2: 23 mmol/L (ref 22–32)
CREATININE: 2.43 mg/dL — AB (ref 0.61–1.24)
Chloride: 95 mmol/L — ABNORMAL LOW (ref 101–111)
GFR, EST AFRICAN AMERICAN: 33 mL/min — AB (ref >60)
GFR, EST NON AFRICAN AMERICAN: 28 mL/min — AB (ref >60)
Glucose, Bld: 143 mg/dL — ABNORMAL HIGH (ref 65–99)
Potassium: 3.5 mmol/L (ref 3.5–5.1)
SODIUM: 133 mmol/L — AB (ref 135–145)
Total Bilirubin: 1 mg/dL (ref 0.3–1.2)
Total Protein: 9.5 g/dL — ABNORMAL HIGH (ref 6.5–8.1)

## 2016-08-18 LAB — I-STAT TROPONIN, ED: Troponin i, poc: 0.01 ng/mL (ref 0.00–0.08)

## 2016-08-18 LAB — LIPASE, BLOOD: LIPASE: 14 U/L (ref 11–51)

## 2016-08-18 MED ORDER — OXYCODONE-ACETAMINOPHEN 5-325 MG PO TABS
1.0000 | ORAL_TABLET | Freq: Once | ORAL | Status: AC
  Administered 2016-08-18: 1 via ORAL
  Filled 2016-08-18: qty 1

## 2016-08-18 MED ORDER — ONDANSETRON HCL 4 MG PO TABS: 4.0000 mg | ORAL_TABLET | Freq: Three times a day (TID) | ORAL | 0 refills | Status: DC | PRN

## 2016-08-18 MED ORDER — SODIUM CHLORIDE 0.9 % IV BOLUS (SEPSIS)
1000.0000 mL | Freq: Once | INTRAVENOUS | Status: AC
  Administered 2016-08-18: 1000 mL via INTRAVENOUS

## 2016-08-18 MED ORDER — FENTANYL CITRATE (PF) 100 MCG/2ML IJ SOLN
50.0000 ug | Freq: Once | INTRAMUSCULAR | Status: AC
  Administered 2016-08-18: 50 ug via INTRAVENOUS
  Filled 2016-08-18: qty 2

## 2016-08-18 MED ORDER — ONDANSETRON HCL 4 MG/2ML IJ SOLN
4.0000 mg | Freq: Once | INTRAMUSCULAR | Status: AC
  Administered 2016-08-18: 4 mg via INTRAVENOUS
  Filled 2016-08-18: qty 2

## 2016-08-18 NOTE — ED Triage Notes (Signed)
Patient c/o abd pain with n/v/d  And central chest pain x 3 days. Patient states that he has diverticulitis and this is new flare up.

## 2016-08-18 NOTE — ED Notes (Signed)
Pt cannot use restroom at this time, aware urine specimen is needed. Urinal Provided  

## 2016-08-18 NOTE — ED Notes (Signed)
Pt reminded of need for urine sample.  

## 2016-08-18 NOTE — Discharge Instructions (Signed)
You're seen today with viral gastroenteritis. Please return with any concerns. If you're unable to stay hydrated please return.

## 2016-08-18 NOTE — ED Provider Notes (Signed)
[facility]-EMERGENCY DEPT Provider Note   CSN: [id number] Arrival date & time: 08/18/16  0907     History   Chief Complaint Chief Complaint  Patient presents with  . Abdominal Pain  . Chest Pain  . Diarrhea  . Emesis    HPI Joshua Baldwin is a 56 y.o. male.  HPI   Patient is a 56 year old male with history of diverticulitis presenting with abdominal pain nausea vomiting diarrhea. He reports a slightly different than his normal diverticulitis as he has vomiting this time. Denies any blood in the stool or vomit. Patient also complains of chest pain in the center of his chest for the last 3 days. All these symptoms happened at the same time and continued. Nothing makes it better or worse.    Past Medical History:  Diagnosis Date  . DDD (degenerative disc disease), lumbar   . GERD (gastroesophageal reflux disease)   . Lung abnormality    ?cyst of lung recently seen on back film told by dr Gerlene Fee  . Nicotine dependence   . Osteoarthritis    Hips    Patient Active Problem List   Diagnosis Date Noted  . Early diverticulitis of the colon 04/23/2016  . AKI (acute kidney injury) (HCC) 04/21/2016  . Leukocytosis 04/21/2016  . Nausea and vomiting 04/21/2016  . Hypokalemia 04/21/2016  . Liver cysts 04/21/2016  . Acute renal failure (HCC) 04/21/2016  . 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 BACK SURGERY  04       Home Medications    Prior to Admission medications   Medication Sig Start Date End Date Taking? Authorizing Provider  HYDROcodone-acetaminophen (NORCO/VICODIN) 5-325 MG tablet Take 2 tablets by mouth every 4 (four) hours as needed. 06/09/16  Yes Benjamin Cartner, PA-C  ibuprofen (ADVIL,MOTRIN) 800 MG tablet Take 400 mg by mouth every 8 (eight) hours as needed for mild pain or moderate pain.   Yes Historical Provider, MD  omeprazole (PRILOSEC) 20 MG capsule Take 20 mg by  mouth 2 (two) times daily. 07/09/16  Yes Historical Provider, MD  ondansetron (ZOFRAN-ODT) 4 MG disintegrating tablet Take 4 mg by mouth every 6 (six) hours as needed for nausea or vomiting.  07/09/16  Yes Historical Provider, MD  sucralfate (CARAFATE) 1 GM/10ML suspension Take 1 g by mouth 4 (four) times daily as needed (stomach coating).    Yes Historical Provider, MD  tiZANidine (ZANAFLEX) 4 MG tablet Take 4 mg by mouth 2 (two) times daily as needed for muscle spasms. 06/01/16  Yes Historical Provider, MD  traMADol (ULTRAM) 50 MG tablet Take 50-100 mg by mouth 2 (two) times daily as needed for pain. 05/24/16  Yes Historical Provider, MD  albuterol (PROVENTIL HFA;VENTOLIN HFA) 108 (90 BASE) MCG/ACT inhaler Inhale 1-2 puffs into the lungs every 6 (six) hours as needed for wheezing or shortness of breath. Patient not taking: Reported on 08/18/2016 10/21/14   Joycie Peek, PA-C  ondansetron (ZOFRAN) 4 MG tablet Take 1 tablet (4 mg total) by mouth every 8 (eight) hours as needed for nausea or vomiting. 08/18/16   Janiylah Hannis Lyn Rush Salce, MD    Family History Family History  Problem Relation Age of Onset  . Other Father     died of work related accident    Social History Social History  Substance Use Topics  . Smoking status: Current Every Day Smoker    Packs/day: 0.25    Years: 10.00  Types: Cigarettes  . Smokeless tobacco: Never Used  . Alcohol use No     Allergies   Gadolinium derivatives and Mrv [mixed respiratory bacterial vaccine]   Review of Systems Review of Systems  Constitutional: Positive for fatigue. Negative for fever.  Gastrointestinal: Positive for abdominal pain, diarrhea, nausea and vomiting.  Neurological: Positive for weakness.  Psychiatric/Behavioral: Negative for behavioral problems and confusion.  All other systems reviewed and are negative.    Physical Exam Updated Vital Signs BP 158/100   Pulse 66   Temp 97.6 F (36.4 C)   Resp 16   Ht 5\' 7"   (1.702 m)   Wt 134 lb (60.8 kg)   SpO2 99%   BMI 20.99 kg/m   Physical Exam  Constitutional: He is oriented to person, place, and time. He appears well-nourished.  HENT:  Head: Normocephalic.  Eyes: Conjunctivae are normal.  Cardiovascular: Normal rate and regular rhythm.   Pulmonary/Chest: Effort normal and breath sounds normal. No respiratory distress.  Abdominal: Bowel sounds are normal. He exhibits distension. There is tenderness.  Neurological: He is oriented to person, place, and time.  Skin: Skin is warm and dry. He is not diaphoretic.  Psychiatric: He has a normal mood and affect. His behavior is normal.     ED Treatments / Results  Labs (all labs ordered are listed, but only abnormal results are displayed) Labs Reviewed  COMPREHENSIVE METABOLIC PANEL - Abnormal; Notable for the following:       Result Value   Sodium 133 (*)    Chloride 95 (*)    Glucose, Bld 143 (*)    BUN 28 (*)    Creatinine, Ser 2.43 (*)    Total Protein 9.5 (*)    Albumin 5.1 (*)    GFR calc non Af Amer 28 (*)    GFR calc Af Amer 33 (*)    All other components within normal limits  CBC - Abnormal; Notable for the following:    WBC 22.1 (*)    All other components within normal limits  URINALYSIS, ROUTINE W REFLEX MICROSCOPIC - Abnormal; Notable for the following:    Color, Urine AMBER (*)    APPearance CLOUDY (*)    Hgb urine dipstick SMALL (*)    Ketones, ur 5 (*)    Protein, ur 100 (*)    Bacteria, UA RARE (*)    Squamous Epithelial / LPF 0-5 (*)    All other components within normal limits  LIPASE, BLOOD  I-STAT TROPOININ, ED    EKG  EKG Interpretation  Date/Time:  Wednesday August 18 2016 09:19:10 EST Ventricular Rate:  110 PR Interval:    QRS Duration: 94 QT Interval:  326 QTC Calculation: 441 R Axis:   92 Text Interpretation:  Sinus tachycardia Since last tracing rate faster Confirmed by Corlis Leak, COURTNEY ([id number]) on 08/18/2016 12:41:48 PM       Radiology Ct  Abdomen Pelvis Wo Contrast  Result Date: 08/18/2016 CLINICAL DATA:  Abdominal pain, nausea, vomiting, diarrhea. EXAM: CT ABDOMEN AND PELVIS WITHOUT CONTRAST TECHNIQUE: Multidetector CT imaging of the abdomen and pelvis was performed following the standard protocol without IV contrast. COMPARISON:  CT scan of April 22, 2016. FINDINGS: Lower chest: No acute abnormality. Hepatobiliary: No gallstones are noted. Stable innumerable hepatic cysts are noted. Pancreas: Unremarkable. No pancreatic ductal dilatation or surrounding inflammatory changes. Spleen: Normal in size without focal abnormality. Adrenals/Urinary Tract: Adrenal glands are unremarkable. Kidneys are normal, without renal calculi, focal lesion, or hydronephrosis. Bladder is  unremarkable. Stomach/Bowel: There is no evidence of bowel obstruction. Vascular/Lymphatic: No significant vascular findings are present. No enlarged abdominal or pelvic lymph nodes. Reproductive: Prostate is unremarkable. Other: No abdominal wall hernia or abnormality. No abdominopelvic ascites. Musculoskeletal: Severe degenerative joint disease and possible avascular necrosis is seen involving the right hip. Status post surgical posterior fixation of lumbar spine. IMPRESSION: Stable appearance of innumerable hepatic cysts compared to prior exam. 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 No other definite abnormality seen in the abdomen or pelvis. Electronically Signed   By: Lupita RaiderJames  Green Jr, M.D.   On: 08/18/2016 13:57    Procedures Procedures (including critical care time)  Medications Ordered in ED Medications  sodium chloride 0.9 % bolus 1,000 mL (0 mLs Intravenous Stopped 08/18/16 1229)  fentaNYL (SUBLIMAZE) injection 50 mcg (50 mcg Intravenous Given 08/18/16 1101)  ondansetron (ZOFRAN) injection 4 mg (4 mg Intravenous Given 08/18/16 1101)  sodium chloride 0.9 % bolus 1,000 mL (0 mLs Intravenous Stopped 08/18/16 1339)    oxyCODONE-acetaminophen (PERCOCET/ROXICET) 5-325 MG per tablet 1 tablet (1 tablet Oral Given 08/18/16 1456)     Initial Impression / Assessment and Plan / ED Course  I have reviewed the triage vital signs and the nursing notes.  Pertinent labs & imaging results that were available during my care of the patient were reviewed by me and considered in my medical decision making (see chart for details).  Clinical Course     Patient is a 56 year old male presenting with diffuse abdominal abdominal pain, nausea, vomiting, diarrhea, chest pain. Chest pain has been going on for 3 days. Doubt ischemia given this. Patient does have tachycardia, new AKI, will give fluids. We'll get CT noncontact given his allergy to contrast.  CT non con normal as well as labs.Susepct viral gastro.  Patient tolerating PO.  Will discharge with zofran, return precuations.  Patient is comfortable, ambulatory, and taking PO at time of discharge.  Patient expressed understanding about return precautions.    Final Clinical Impressions(s) / ED Diagnoses   Final diagnoses:  Nausea vomiting and diarrhea    New Prescriptions Discharge Medication List as of 08/18/2016  2:50 PM    START taking these medications   Details  ondansetron (ZOFRAN) 4 MG tablet Take 1 tablet (4 mg total) by mouth every 8 (eight) hours as needed for nausea or vomiting., Starting Wed 08/18/2016, Print         Su Duma Randall AnLyn Idalis Hoelting, MD 08/18/16 [phone] [id number]

## 2016-08-18 NOTE — ED Notes (Signed)
Pt has specimen cup and made aware of need for urine specimen  

## 2016-09-09 ENCOUNTER — Telehealth: Payer: Self-pay | Admitting: Internal Medicine

## 2016-09-09 NOTE — Telephone Encounter (Signed)
Spoke with patient and he wrote down what he needs to get at the store for his prepping.

## 2016-09-10 ENCOUNTER — Encounter: Payer: Medicaid Other | Admitting: Internal Medicine

## 2016-09-10 ENCOUNTER — Telehealth: Payer: Self-pay | Admitting: Internal Medicine

## 2016-09-10 NOTE — Telephone Encounter (Signed)
No charge. 

## 2016-10-01 ENCOUNTER — Telehealth: Payer: Self-pay | Admitting: Internal Medicine

## 2016-10-01 MED ORDER — BISACODYL 5 MG PO TBEC: DELAYED_RELEASE_TABLET | ORAL | 0 refills | Status: DC

## 2016-10-01 MED ORDER — POLYETHYLENE GLYCOL 3350 17 GM/SCOOP PO POWD: ORAL | 0 refills | Status: DC

## 2016-10-01 NOTE — Telephone Encounter (Signed)
Per patient his insurance will pay for his prep so I sent in the dulcolax and miralax for pick up.  He knows he has to go get the Gatorade to mix it in.

## 2016-10-05 ENCOUNTER — Ambulatory Visit (AMBULATORY_SURGERY_CENTER): Payer: Medicaid Other | Admitting: Internal Medicine

## 2016-10-05 ENCOUNTER — Encounter: Payer: Self-pay | Admitting: Internal Medicine

## 2016-10-05 VITALS — BP 105/67 | HR 73 | Temp 98.9°F | Resp 18 | Ht 67.0 in | Wt 137.0 lb

## 2016-10-05 DIAGNOSIS — Z1212 Encounter for screening for malignant neoplasm of rectum: Secondary | ICD-10-CM

## 2016-10-05 DIAGNOSIS — Z1211 Encounter for screening for malignant neoplasm of colon: Secondary | ICD-10-CM | POA: Diagnosis not present

## 2016-10-05 DIAGNOSIS — R1310 Dysphagia, unspecified: Secondary | ICD-10-CM | POA: Diagnosis not present

## 2016-10-05 DIAGNOSIS — R1319 Other dysphagia: Secondary | ICD-10-CM

## 2016-10-05 MED ORDER — SODIUM CHLORIDE 0.9 % IV SOLN: 500.0000 mL | INTRAVENOUS | Status: DC

## 2016-10-05 MED ORDER — OMEPRAZOLE 20 MG PO CPDR: 20.0000 mg | DELAYED_RELEASE_CAPSULE | Freq: Every day | ORAL | 11 refills | Status: AC

## 2016-10-05 NOTE — Op Note (Addendum)
[facility] Patient Name: Joshua PockGregory Fuchs Procedure Date: 10/05/2016 2:06 PM MRN: [id number] Endoscopist: Iva Booparl E Quita Mcgrory , MD Age: [age] Referring MD:  Date of Birth: [birth year] Gender: Male Account #: [id number] Procedure:                Colonoscopy Indications:              Screening for colorectal malignant neoplasm Medicines:                Propofol per Anesthesia, Monitored Anesthesia Care Procedure:                Pre-Anesthesia Assessment:                           - Prior to the procedure, a History and Physical                            was performed, and patient medications and                            allergies were reviewed. The patient's tolerance of                            previous anesthesia was also reviewed. The risks                            and benefits of the procedure and the sedation                            options and risks were discussed with the patient.                            All questions were answered, and informed consent                            was obtained. Prior Anticoagulants: The patient has                            taken no previous anticoagulant or antiplatelet                            agents. ASA Grade Assessment: II - A patient with                            mild systemic disease. After reviewing the risks                            and benefits, the patient was deemed in                            satisfactory condition to undergo the procedure.                           After obtaining informed consent, the colonoscope  was passed under direct vision. Throughout the                            procedure, the patient's blood pressure, pulse, and                            oxygen saturations were monitored continuously. The                            Colonoscope was introduced through the anus with                            the intention of advancing to the cecum. The scope           was advanced to the ascending colon before the                            procedure was aborted. Medications were given. The                            colonoscopy was aborted due to poor bowel prep and                            poor endoscopic visualization. The colonoscopy was                            performed without difficulty. The patient tolerated                            the procedure well. The quality of the bowel                            preparation was poor and 40 percent obscured. The                            appendiceal orifice and the rectum were                            photographed. Scope In: 2:16:20 PM Scope Out: 2:23:00 PM Scope Withdrawal Time: 0 hours 3 minutes 27 seconds  Total Procedure Duration: 0 hours 6 minutes 40 seconds  Findings:                 The perianal and digital rectal examinations were                            normal. Pertinent negatives include normal prostate                            (size, shape, and consistency).                           A moderate amount of semi-solid solid stool was  found in the ascending colon and in the cecum,                            interfering with visualization.                           The exam was otherwise without abnormality on                            direct and retroflexion views. Complications:            No immediate complications. Estimated Blood Loss:     Estimated blood loss: none. Impression:               - The procedure was aborted due to poor bowel prep                            and poor endoscopic visualization.                           - Preparation of the colon was poor.                           - Stool in the ascending colon and in the cecum.                           - The examination was otherwise normal on direct                            and retroflexion views.                           - No specimens collected. Recommendation:           -  Patient has a contact number available for                            emergencies. The signs and symptoms of potential                            delayed complications were discussed with the                            patient. Return to normal activities tomorrow.                            Written discharge instructions were provided to the                            patient.                           - Resume previous diet.                           - Continue present medications.                           -  Repeat colonoscopy at the next available                            appointment because the examination was incomplete                            and because the bowel preparation was poor.                           - Would use a double prep - 2 days before take 4                            dulcolax and drink 4 glasses of MiraLax and 1 d                            before do a full MiraLax prep                           - He is going to schedule a hip replacement and                            wants to attempt repeat colonoscopy later in year -                            will place a 6 month colon recall.                           New info per wife - had a colonoscopy 5 yrs ago and                            1 polyp removed. Iva Boop, MD 10/05/2016 2:34:16 PM This report has been signed electronically.

## 2016-10-05 NOTE — Progress Notes (Signed)
Spontaneous respirations throughout. VSS. Resting comfortably. To PACU on room air. Report to  Jane RN. 

## 2016-10-05 NOTE — Op Note (Signed)
[facility] Patient Name: Joshua Baldwin Procedure Date: 10/05/2016 2:06 PM MRN: [id number] Endoscopist: Iva Boop , MD Age: 56 Referring MD:  Date of Birth: Aug 12, 1960 Gender: Male Account #: [id number] Procedure:                Upper GI endoscopy Indications:              Dysphagia Medicines:                Propofol per Anesthesia, Monitored Anesthesia Care Procedure:                Pre-Anesthesia Assessment:                           - Prior to the procedure, a History and Physical                            was performed, and patient medications and                            allergies were reviewed. The patient's tolerance of                            previous anesthesia was also reviewed. The risks                            and benefits of the procedure and the sedation                            options and risks were discussed with the patient.                            All questions were answered, and informed consent                            was obtained. Prior Anticoagulants: The patient has                            taken no previous anticoagulant or antiplatelet                            agents. ASA Grade Assessment: II - A patient with                            mild systemic disease. After reviewing the risks                            and benefits, the patient was deemed in                            satisfactory condition to undergo the procedure.                           After obtaining informed consent, the endoscope was  passed under direct vision. Throughout the                            procedure, the patient's blood pressure, pulse, and                            oxygen saturations were monitored continuously. The                            Model [id number]-[id number]) scope was introduced                            through the mouth, and advanced to the second part                            of duodenum. The  upper GI endoscopy was                            accomplished without difficulty. The patient                            tolerated the procedure well. Scope In: Scope Out: Findings:                 The esophagus was normal.                           The stomach was normal.                           The examined duodenum was normal.                           The cardia and gastric fundus were normal on                            retroflexion. Complications:            No immediate complications. Estimated Blood Loss:     Estimated blood loss: none. Impression:               - Normal esophagus.                           - Normal stomach.                           - Normal examined duodenum.                           - No specimens collected. I think starting PPI                            resolved symptoms. Recommendation:           - Patient has a contact number available for  emergencies. The signs and symptoms of potential                            delayed complications were discussed with the                            patient. Return to normal activities tomorrow.                            Written discharge instructions were provided to the                            patient.                           - Resume previous diet.                           - Continue present medications.                           - reduce omeprazole to 20 mg qd [name], MD 10/05/2016 2:30:09 PM This report has been signed electronically.

## 2016-10-05 NOTE — Patient Instructions (Addendum)
The upper endoscopy exam was normal. I think the omeprazole fixed your heartburn + reflux and the swallowing problems.  Unfortunately the colon was not completely cleaned out so we need to reschedule the colonoscopy with extra prep.  I appreciate the opportunity to care for you. Iva Booparl E. Gessner, MD, [facility]  Impression/Recommendations:  Continue present medications.   Reduce Omeprazole to 20 mg. By mouth daily.  Repeat colonoscopy at next available appointment due to poor bowel preparation.   YOU HAD AN ENDOSCOPIC PROCEDURE TODAY AT THE [facility]:   Refer to the procedure report that was given to you for any specific questions about what was found during the examination.  If the procedure report does not answer your questions, please call your gastroenterologist to clarify.  If you requested that your care partner not be given the details of your procedure findings, then the procedure report has been included in a sealed envelope for you to review at your convenience later.  YOU SHOULD EXPECT: Some feelings of bloating in the abdomen. Passage of more gas than usual.  Walking can help get rid of the air that was put into your GI tract during the procedure and reduce the bloating. If you had a lower endoscopy (such as a colonoscopy or flexible sigmoidoscopy) you may notice spotting of blood in your stool or on the toilet paper. If you underwent a bowel prep for your procedure, you may not have a normal bowel movement for a few days.  Please Note:  You might notice some irritation and congestion in your nose or some drainage.  This is from the oxygen used during your procedure.  There is no need for concern and it should clear up in a day or so.  SYMPTOMS TO REPORT IMMEDIATELY:   Following lower endoscopy (colonoscopy or flexible sigmoidoscopy):  Excessive amounts of blood in the stool  Significant tenderness or worsening of abdominal pains  Swelling of the abdomen that  is new, acute  Fever of 100F or higher   Following upper endoscopy (EGD)  Vomiting of blood or coffee ground material  New chest pain or pain under the shoulder blades  Painful or persistently difficult swallowing  New shortness of breath  Fever of 100F or higher  Black, tarry-looking stools  For urgent or emergent issues, a gastroenterologist can be reached at any hour by calling [phone].   DIET:  We do recommend a small meal at first, but then you may proceed to your regular diet.  Drink plenty of fluids but you should avoid alcoholic beverages for 24 hours.  ACTIVITY:  You should plan to take it easy for the rest of today and you should NOT DRIVE or use heavy machinery until tomorrow (because of the sedation medicines used during the test).    FOLLOW UP: Our staff will call the number listed on your records the next business day following your procedure to check on you and address any questions or concerns that you may have regarding the information given to you following your procedure. If we do not reach you, we will leave a message.  However, if you are feeling well and you are not experiencing any problems, there is no need to return our call.  We will assume that you have returned to your regular daily activities without incident.  If any biopsies were taken you will be contacted by phone or by letter within the next 1-3 weeks.  Please call us at [phone]  if you have not heard about the biopsies in 3 weeks.    SIGNATURES/CONFIDENTIALITY: You and/or your care partner have signed paperwork which will be entered into your electronic medical record.  These signatures attest to the fact that that the information above on your After Visit Summary has been reviewed and is understood.  Full responsibility of the confidentiality of this discharge information lies with you and/or your care-partner.

## 2016-10-06 ENCOUNTER — Telehealth: Payer: Self-pay

## 2016-10-06 NOTE — Telephone Encounter (Signed)
  Follow up Call-  Call back number 10/05/2016  Post procedure Call Back phone  # [phone]  Permission to leave phone message Yes  Some recent data might be hidden     Patient questions:  Do you have a fever, pain , or abdominal swelling? No. Pain Score  0 *  Have you tolerated food without any problems? Yes.    Have you been able to return to your normal activities? No.  Do you have any questions about your discharge instructions: Diet   No. Medications  No. Follow up visit  No.  Do you have questions or concerns about your Care? No.  Actions: * If pain score is 4 or above: No action needed, pain <4.

## 2016-10-25 DIAGNOSIS — G8929 Other chronic pain: Secondary | ICD-10-CM

## 2016-10-25 DIAGNOSIS — K219 Gastro-esophageal reflux disease without esophagitis: Secondary | ICD-10-CM

## 2016-10-25 DIAGNOSIS — M545 Low back pain, unspecified: Secondary | ICD-10-CM

## 2016-10-25 DIAGNOSIS — M1611 Unilateral primary osteoarthritis, right hip: Secondary | ICD-10-CM

## 2016-10-25 NOTE — H&P (Signed)
PREOPERATIVE H&P Patient ID: Joshua Baldwin MRN: [id number] DOB/AGE: 1960/08/29 56 y.o.  Chief Complaint: djd right hip  Planned Procedure Date: 11/16/16  Medical and Cardiac Clearance by Dr. Concepcion Elk    HPI: Joshua Baldwin is a 56 y.o. male current smoker, former alcohol abuse history with remote bleeding gastric ulcer.  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 Darrelyn Hillock back in 2005, and later in 2015 by Dr. Gerlene Fee for continued back pain.  He presents for evaluation of djd with AVN right hip. The patient has a history of pain and functional disability in the right hip due to arthritis and has failed non-surgical conservative treatments for greater than 12 weeks to include NSAID's and/or analgesics, use of assistive devices and activity modification.  Onset of symptoms was gradual, starting 5 years ago with gradually worsening course since that time.  Patient currently rates pain at 10 out of 10 with activity. Patient has night pain, worsening of pain with activity and weight bearing, pain that interferes with activities of daily living and pain with passive range of motion.  Patient has evidence of subchondral cysts, subchondral sclerosis, periarticular osteophytes and joint space narrowing by imaging studies. There is no active infection.  Past Medical History:  Diagnosis Date  . DDD (degenerative disc disease), lumbar   . GERD (gastroesophageal reflux disease)   . Lung abnormality    ?cyst of lung recently seen on back film told by dr Gerlene Fee  . Nicotine dependence   . Osteoarthritis    Hips   Past Surgical History:  Procedure Laterality Date  . BACK SURGERY  04   Allergies  Allergen Reactions  . Gadolinium Derivatives Shortness Of Breath and Nausea And Vomiting  . Mrv [Mixed Respiratory Bacterial Vaccine] Other (See Comments)     Unable to recall reaction- unknown   Prior to Admission medications   Medication Sig Start Date End Date Taking? Authorizing Provider  albuterol (PROVENTIL HFA;VENTOLIN HFA) 108 (90 BASE) MCG/ACT inhaler Inhale 1-2 puffs into the lungs every 6 (six) hours as needed for wheezing or shortness of breath. Patient not taking: Reported on 08/18/2016 10/21/14   Joycie Peek, PA-C  HYDROcodone-acetaminophen (NORCO/VICODIN) 5-325 MG tablet Take 2 tablets by mouth every 4 (four) hours as needed. 06/09/16   Joycie Peek, PA-C  ibuprofen (ADVIL,MOTRIN) 800 MG tablet Take 400 mg by mouth every 8 (eight) hours as needed for mild pain or moderate pain.    Historical Provider, MD  omeprazole (PRILOSEC) 20 MG capsule Take 1 capsule (20 mg total) by mouth daily before breakfast. 10/05/16   Iva Boop, MD  ondansetron (ZOFRAN) 4 MG tablet Take 1 tablet (4 mg total) by mouth every 8 (eight) hours as needed for nausea or vomiting. 08/18/16   Courteney Lyn Mackuen, MD  ondansetron (ZOFRAN-ODT) 4 MG disintegrating tablet Take 4 mg by mouth every 6 (six) hours as needed for nausea or vomiting.  07/09/16   Historical Provider, MD  sucralfate (CARAFATE) 1 GM/10ML suspension Take 1 g by mouth 4 (four) times daily as needed (stomach coating).     Historical Provider, MD  tiZANidine (ZANAFLEX) 4 MG tablet Take 4 mg by mouth 2 (two) times daily as needed for muscle spasms. 06/01/16   Historical Provider, MD  traMADol (ULTRAM) 50 MG tablet Take 50-100 mg by mouth 2 (two) times daily as needed for pain. 05/24/16  Historical Provider, MD   Social History   Social History  . Marital status: Married    Spouse name: N/A  . Number of children: N/A  . Years of education: N/A   Social History Main Topics  . Smoking status: Current Every Day Smoker    Packs/day: 0.25    Years: 10.00    Types: Cigarettes  . Smokeless tobacco: Never Used  . Alcohol use No  . Drug use: No  . Sexual activity: Not on file   Other Topics  Concern  . Not on file   Social History Narrative   Married   1 son 2 daughters   Not working ? Disabled   Played college baseball - injuries related to that   1 caffeine/qd   Family History  Problem Relation Age of Onset  . Other Father     died of work related accident    ROS: Currently denies lightheadedness, dizziness, Fever, chills, CP, SOB. No personal history of DVT, PE, MI, or CVA. Top and bottom partial.  No loose teeth.  Filling fell out 3 days ago - Right lower tooth. All other systems have been reviewed and were otherwise currently negative with the exception of those mentioned in the HPI and as above.  Objective: Vitals: Ht: 5'7" Wt: 125 Temp: 97.6 BP: 124/86 Pulse: 71 O2 99% on room air. Physical Exam: General: Alert, NAD.  Trendelenberg Gait, utilizes a cane. HEENT: EOMI, Good Neck Extension  Pulm: No increased work of breathing.  Clear B/L A/P w/o crackle or wheeze.  CV: RRR, No m/g/r appreciated  GI: soft, NT, ND Neuro: Neuro without gross focal deficit.  Sensation intact distally Skin: No lesions in the area of chief complaint MSK/Surgical Site: Right Hip Non tender over greater trochanter.  Pain with passive ROM.  Positive Stinchfield.  4/5 strength.  NVI.  Sensation intact distally.   Imaging Review Plain radiographs right hip demonstrate severe wear with superior migration and destruction of the femoral head eroding into the superior acetabulum with shortening.  Two views of the right knee shows low riding patella consistent with previous quad tendon rupture.  Appears to be catching in the anterior aspect of the joint.    Assessment: djd right hip Principal Problem:   Primary osteoarthritis of right hip Active Problems:   GERD (gastroesophageal reflux disease)   Chronic low back pain   Plan: Plan for Procedure(s): TOTAL HIP ARTHROPLASTY ANTERIOR APPROACH  The patient history, physical exam, clinical judgement of the provider and imaging are  consistent with end stage degenerative joint disease and total joint arthroplasty is deemed medically necessary. The treatment options including medical management, injection therapy, and arthroplasty were discussed at length. The risks and benefits of Procedure(s): TOTAL HIP ARTHROPLASTY ANTERIOR APPROACH were presented and reviewed.  The risks of nonoperative treatment, versus surgical intervention including but not limited to continued pain, aseptic loosening, stiffness, dislocation/subluxation, infection, bleeding, nerve injury, blood clots, cardiopulmonary complications, morbidity, mortality, among others were discussed. The patient verbalizes understanding and wishes to proceed with the plan.  Patient is being admitted for inpatient treatment for surgery, pain control, PT, OT, prophylactic antibiotics, VTE prophylaxis, progressive ambulation, ADL's and discharge planning.   Dental prophylaxis discussed and recommended for 2 years postoperatively.   The patient is in the process of establishing care with pain management (Dr. Tollie Eth) and admits to recent pain medication use that was not prescribed to him.  He must establish care prior to surgery and understands that we will  only be managing acute perioperative pain.  He also recently broke out a filling on the bottom right side.  He denies fever, drainage or sign of infection, but does have pain.  He must have this repaired and must be cleared and free of infection prior to surgery.  The patient does meet the criteria for TXA which will be used perioperatively via IV.    Xarelto will be used postoperatively for DVT prophylaxis in addition to SCDs, and early ambulation.  H/O bleeding ulcer remotely from etoh.  Current severe GERD.  The patient is planning to be discharged home with home health services ([facility]) in care of his Wife.  Albina BilletHenry Calvin Martensen III, PA-C 10/25/2016 2:08 PM

## 2016-11-02 ENCOUNTER — Emergency Department (HOSPITAL_COMMUNITY): Payer: Medicaid Other

## 2016-11-02 ENCOUNTER — Emergency Department (HOSPITAL_COMMUNITY)
Admission: EM | Admit: 2016-11-02 | Discharge: 2016-11-02 | Disposition: A | Discharge status: Home / Self Care | Payer: Medicaid Other | Attending: Emergency Medicine | Admitting: Emergency Medicine

## 2016-11-02 ENCOUNTER — Encounter (HOSPITAL_COMMUNITY): Payer: Self-pay

## 2016-11-02 DIAGNOSIS — E860 Dehydration: Secondary | ICD-10-CM | POA: Diagnosis not present

## 2016-11-02 DIAGNOSIS — R1084 Generalized abdominal pain: Secondary | ICD-10-CM

## 2016-11-02 DIAGNOSIS — R112 Nausea with vomiting, unspecified: Secondary | ICD-10-CM | POA: Diagnosis not present

## 2016-11-02 DIAGNOSIS — Z79899 Other long term (current) drug therapy: Secondary | ICD-10-CM | POA: Diagnosis not present

## 2016-11-02 DIAGNOSIS — R109 Unspecified abdominal pain: Secondary | ICD-10-CM | POA: Diagnosis present

## 2016-11-02 DIAGNOSIS — R0789 Other chest pain: Secondary | ICD-10-CM

## 2016-11-02 DIAGNOSIS — Z87891 Personal history of nicotine dependence: Secondary | ICD-10-CM | POA: Insufficient documentation

## 2016-11-02 LAB — I-STAT TROPONIN, ED: TROPONIN I, POC: 0 ng/mL (ref 0.00–0.08)

## 2016-11-02 LAB — COMPREHENSIVE METABOLIC PANEL
ALBUMIN: 4.4 g/dL (ref 3.5–5.0)
ALK PHOS: 84 U/L (ref 38–126)
ALT: 36 U/L (ref 17–63)
ANION GAP: 13 (ref 5–15)
AST: 34 U/L (ref 15–41)
BUN: 14 mg/dL (ref 6–20)
CALCIUM: 10.4 mg/dL — AB (ref 8.9–10.3)
CO2: 27 mmol/L (ref 22–32)
Chloride: 100 mmol/L — ABNORMAL LOW (ref 101–111)
Creatinine, Ser: 1.3 mg/dL — ABNORMAL HIGH (ref 0.61–1.24)
GFR calc non Af Amer: >60 mL/min (ref >60)
Glucose, Bld: 181 mg/dL — ABNORMAL HIGH (ref 65–99)
Potassium: 3.4 mmol/L — ABNORMAL LOW (ref 3.5–5.1)
SODIUM: 140 mmol/L (ref 135–145)
TOTAL PROTEIN: 8.7 g/dL — AB (ref 6.5–8.1)
Total Bilirubin: 0.8 mg/dL (ref 0.3–1.2)

## 2016-11-02 LAB — CBC
HCT: 43 % (ref 39.0–52.0)
Hemoglobin: 15.6 g/dL (ref 13.0–17.0)
MCH: 30.7 pg (ref 26.0–34.0)
MCHC: 36.3 g/dL — ABNORMAL HIGH (ref 30.0–36.0)
MCV: 84.6 fL (ref 78.0–100.0)
Platelets: 400 10*3/uL (ref 150–400)
RBC: 5.08 MIL/uL (ref 4.22–5.81)
RDW: 12.9 % (ref 11.5–15.5)
WBC: 12.4 10*3/uL — ABNORMAL HIGH (ref 4.0–10.5)

## 2016-11-02 LAB — URINALYSIS, ROUTINE W REFLEX MICROSCOPIC
GLUCOSE, UA: NEGATIVE mg/dL
Hgb urine dipstick: NEGATIVE
Ketones, ur: 5 mg/dL — AB
Leukocytes, UA: NEGATIVE
Nitrite: NEGATIVE
PH: 5 (ref 5.0–8.0)
PROTEIN: 100 mg/dL — AB
Specific Gravity, Urine: 1.03 (ref 1.005–1.030)

## 2016-11-02 LAB — LIPASE, BLOOD: LIPASE: 16 U/L (ref 11–51)

## 2016-11-02 MED ORDER — ONDANSETRON HCL 4 MG/2ML IJ SOLN
4.0000 mg | Freq: Once | INTRAMUSCULAR | Status: AC
  Administered 2016-11-02: 4 mg via INTRAVENOUS
  Filled 2016-11-02: qty 2

## 2016-11-02 MED ORDER — SODIUM CHLORIDE 0.9 % IV BOLUS (SEPSIS)
1000.0000 mL | Freq: Once | INTRAVENOUS | Status: AC
  Administered 2016-11-02: 1000 mL via INTRAVENOUS

## 2016-11-02 MED ORDER — ONDANSETRON 4 MG PO TBDP: 4.0000 mg | ORAL_TABLET | Freq: Three times a day (TID) | ORAL | 0 refills | Status: DC | PRN

## 2016-11-02 MED ORDER — MORPHINE SULFATE (PF) 4 MG/ML IV SOLN
4.0000 mg | Freq: Once | INTRAVENOUS | Status: AC
Start: 2016-11-02 — End: 2016-11-02
  Administered 2016-11-02: 4 mg via INTRAVENOUS
  Filled 2016-11-02: qty 1

## 2016-11-02 NOTE — ED Provider Notes (Signed)
[facility]-EMERGENCY DEPT Provider Note   CSN: [id number] Arrival date & time: 11/02/16  0750     History   Chief Complaint Chief Complaint  Patient presents with  . Emesis  . Chest Pain  . Abdominal Pain    HPI Joshua Baldwin is a 56 y.o. male.  Pt presents to the ED today with CP and abdominal pain.  This is similar to what he presented to the ED for in January and has a hx of esophageal dysmotility.   A CT abd/pelvis was done then and showed no acute abn.  The pt did f/u with GI  Colonoscopy on 2/27 that did not show much as he had poor bowel prep.  Dr. Leone Payor recommended f/u with repeat colonoscopy with better bowel prep.  This has not yet been rescheduled.  The pt said he's had cp and abdominal pain for 2 days.  He has had n/v.  The pt denies f/c.      Past Medical History:  Diagnosis Date  . DDD (degenerative disc disease), lumbar   . GERD (gastroesophageal reflux disease)   . Lung abnormality    ?cyst of lung recently seen on back film told by dr Gerlene Fee  . Nicotine dependence   . Osteoarthritis    Hips    Patient Active Problem List   Diagnosis Date Noted  . GERD (gastroesophageal reflux disease) 10/25/2016  . Chronic low back pain 10/25/2016  . Primary osteoarthritis of right hip 10/25/2016  . Early diverticulitis of the colon 04/23/2016  . AKI (acute kidney injury) (HCC) 04/21/2016  . Leukocytosis 04/21/2016  . Nausea and vomiting 04/21/2016  . Hypokalemia 04/21/2016  . Liver cysts 04/21/2016  . Acute renal failure (HCC) 04/21/2016  . 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 BACK SURGERY  04       Home Medications    Prior to Admission medications   Medication Sig Start Date End Date Taking? Authorizing Provider  albuterol (PROVENTIL HFA;VENTOLIN HFA) 108 (90 BASE) MCG/ACT inhaler Inhale 1-2 puffs into the lungs every 6 (six) hours as needed for wheezing or  shortness of breath. Patient not taking: Reported on 08/18/2016 10/21/14   Joycie Peek, PA-C  HYDROcodone-acetaminophen (NORCO/VICODIN) 5-325 MG tablet Take 2 tablets by mouth every 4 (four) hours as needed. 06/09/16   Joycie Peek, PA-C  ibuprofen (ADVIL,MOTRIN) 800 MG tablet Take 400 mg by mouth every 8 (eight) hours as needed for mild pain or moderate pain.    Historical Provider, MD  omeprazole (PRILOSEC) 20 MG capsule Take 1 capsule (20 mg total) by mouth daily before breakfast. 10/05/16   Iva Boop, MD  ondansetron (ZOFRAN ODT) 4 MG disintegrating tablet Take 1 tablet (4 mg total) by mouth every 8 (eight) hours as needed for nausea or vomiting. 11/02/16   Jacalyn Lefevre, MD  ondansetron (ZOFRAN) 4 MG tablet Take 1 tablet (4 mg total) by mouth every 8 (eight) hours as needed for nausea or vomiting. 08/18/16   Courteney Lyn Mackuen, MD  sucralfate (CARAFATE) 1 GM/10ML suspension Take 1 g by mouth 4 (four) times daily as needed (stomach coating).     Historical Provider, MD  tiZANidine (ZANAFLEX) 4 MG tablet Take 4 mg by mouth 2 (two) times daily as needed for muscle spasms. 06/01/16   Historical Provider, MD  traMADol (ULTRAM) 50 MG tablet Take 50-100 mg by mouth 2 (two) times daily as needed for pain. 05/24/16  Historical Provider, MD    Family History Family History  Problem Relation Age of Onset  . Other Father     died of work related accident    Social History Social History  Substance Use Topics  . Smoking status: Former Smoker    Packs/day: 0.25    Years: 10.00    Types: Cigarettes  . Smokeless tobacco: Never Used  . Alcohol use No     Allergies   Gadolinium derivatives and Mrv [mixed respiratory bacterial vaccine]   Review of Systems Review of Systems  Cardiovascular: Positive for chest pain.  Gastrointestinal: Positive for abdominal pain, nausea and vomiting.  All other systems reviewed and are negative.    Physical Exam Updated Vital Signs BP (!)  163/103   Pulse (!) 59   Temp 98.1 F (36.7 C) (Oral)   Resp 18   Ht 5\' 7"  (1.702 m)   Wt 130 lb (59 kg)   SpO2 98%   BMI 20.36 kg/m   Physical Exam  Constitutional: He is oriented to person, place, and time. He appears well-developed. He appears distressed.  HENT:  Head: Normocephalic and atraumatic.  Right Ear: External ear normal.  Left Ear: External ear normal.  Nose: Nose normal.  Mouth/Throat: Mucous membranes are dry.  Eyes: Conjunctivae and EOM are normal. Pupils are equal, round, and reactive to light.  Neck: Normal range of motion. Neck supple.  Cardiovascular: Normal rate, regular rhythm, normal heart sounds and intact distal pulses.   Pulmonary/Chest: Effort normal and breath sounds normal.  Abdominal: Soft. There is generalized tenderness.  Musculoskeletal: Normal range of motion.       Arms: Neurological: He is alert and oriented to person, place, and time.  Skin: Skin is warm.  Psychiatric: He has a normal mood and affect. His behavior is normal. Judgment and thought content normal.  Nursing note and vitals reviewed.    ED Treatments / Results  Labs (all labs ordered are listed, but only abnormal results are displayed) Labs Reviewed  COMPREHENSIVE METABOLIC PANEL - Abnormal; Notable for the following:       Result Value   Potassium 3.4 (*)    Chloride 100 (*)    Glucose, Bld 181 (*)    Creatinine, Ser 1.30 (*)    Calcium 10.4 (*)    Total Protein 8.7 (*)    All other components within normal limits  CBC - Abnormal; Notable for the following:    WBC 12.4 (*)    MCHC 36.3 (*)    All other components within normal limits  URINALYSIS, ROUTINE W REFLEX MICROSCOPIC - Abnormal; Notable for the following:    Color, Urine AMBER (*)    APPearance HAZY (*)    Bilirubin Urine SMALL (*)    Ketones, ur 5 (*)    Protein, ur 100 (*)    Bacteria, UA MANY (*)    Squamous Epithelial / LPF 0-5 (*)    All other components within normal limits  LIPASE, BLOOD    I-STAT TROPOININ, ED    EKG  EKG Interpretation  Date/Time:  Tuesday November 02 2016 08:07:33 EDT Ventricular Rate:  73 PR Interval:    QRS Duration: 88 QT Interval:  382 QTC Calculation: 421 R Axis:   66 Text Interpretation:  Sinus rhythm Nonspecific T abnrm, anterolateral leads Minimal ST elevation, anterior leads No significant change since last tracing Confirmed by [facility] MD, Carr Shartzer ([id number]) on 11/02/2016 8:14:28 AM Also confirmed by [facility] MD, Berneita Sanagustin [phone]), editor Valentina Lucks  CT, Jola BabinskiMarilyn [phone]([id number])  on 11/02/2016 8:25:40 AM       Radiology Dg Chest 2 View  Result Date: 11/02/2016 CLINICAL DATA:  Patient complains of mid chest, mid abd pain, n/v x 2 days. EXAM: CHEST  2 VIEW COMPARISON:  04/21/2016; 11/02/2015; 10/21/2014 FINDINGS: Grossly unchanged cardiac silhouette and mediastinal contours with atherosclerotic plaque within thoracic aorta. No focal airspace opacities. No pleural effusion or pneumothorax. No evidence of edema. No acute osseus abnormalities. Post lower lumbar paraspinal fusion and intervertebral disc space replacement, incompletely evaluated. Age-indeterminate mild (under 25%) compression deformity involving the inferior endplate of the approximate L1 vertebral body, incompletely evaluated. IMPRESSION: 1.  No acute cardiopulmonary disease. 2.  Aortic Atherosclerosis (ICD10-170.0) Electronically Signed   By: Simonne ComeJohn  Watts M.D.   On: 11/02/2016 08:42    Procedures Procedures (including critical care time)  Medications Ordered in ED Medications  sodium chloride 0.9 % bolus 1,000 mL (0 mLs Intravenous Stopped 11/02/16 0918)  ondansetron (ZOFRAN) injection 4 mg (4 mg Intravenous Given 11/02/16 0823)  morphine 4 MG/ML injection 4 mg (4 mg Intravenous Given 11/02/16 0823)  sodium chloride 0.9 % bolus 1,000 mL (0 mLs Intravenous Stopped 11/02/16 1124)     Initial Impression / Assessment and Plan / ED Course  I have reviewed the triage vital signs and the nursing  notes.  Pertinent labs & imaging results that were available during my care of the patient were reviewed by me and considered in my medical decision making (see chart for details).    Pt is feeling much better.  He is able to ambulate and tolerate po fluids.  He knows to return if worse and to f/u with his pcp and Dr. Leone PayorGessner (GI).   Final Clinical Impressions(s) / ED Diagnoses   Final diagnoses:  Generalized abdominal pain  Dehydration  Chest wall pain  Nausea and vomiting, intractability of vomiting not specified, unspecified vomiting type    New Prescriptions New Prescriptions   ONDANSETRON (ZOFRAN ODT) 4 MG DISINTEGRATING TABLET    Take 1 tablet (4 mg total) by mouth every 8 (eight) hours as needed for nausea or vomiting.     Jacalyn LefevreJulie Floyed Masoud, MD 11/02/16 [phone]

## 2016-11-02 NOTE — ED Notes (Signed)
Discharge instructions, follow up care, and rx x1 reviewed with patient. Patient verbalized understanding. 

## 2016-11-02 NOTE — ED Notes (Signed)
Patient made aware of urine sample. Urinal placed at bedside. Patient encouraged to void when able.

## 2016-11-02 NOTE — ED Notes (Signed)
RN is starting line and collecting blood work at this time.

## 2016-11-02 NOTE — ED Notes (Signed)
Patient transported to X-ray 

## 2016-11-02 NOTE — ED Notes (Signed)
Patient ambulated with personal cane without difficulty. Patient denied dizziness, nausea, or shortness of breath. Patient given water. EDP aware.

## 2016-11-02 NOTE — ED Triage Notes (Signed)
Patient c/o mid chest pain, vomiting, and mid abdominal pain x 2 days.

## 2016-11-02 NOTE — ED Notes (Signed)
ED Provider at bedside. 

## 2016-11-05 ENCOUNTER — Other Ambulatory Visit (HOSPITAL_COMMUNITY): Payer: Medicaid Other

## 2016-11-16 ENCOUNTER — Inpatient Hospital Stay: Admit: 2016-11-16 | Payer: Medicaid Other | Admitting: Orthopedic Surgery

## 2016-11-16 SURGERY — ARTHROPLASTY, HIP, TOTAL, ANTERIOR APPROACH
Anesthesia: Spinal | Laterality: Right

## 2016-11-25 ENCOUNTER — Inpatient Hospital Stay (HOSPITAL_COMMUNITY): Admission: RE | Admit: 2016-11-25 | Payer: Medicaid Other | Source: Ambulatory Visit

## 2016-12-07 ENCOUNTER — Inpatient Hospital Stay: Admit: 2016-12-07 | Payer: Medicaid Other | Admitting: Orthopedic Surgery

## 2016-12-07 SURGERY — ARTHROPLASTY, HIP, TOTAL, ANTERIOR APPROACH
Anesthesia: Spinal | Laterality: Right

## 2016-12-15 ENCOUNTER — Encounter: Payer: Self-pay | Admitting: Internal Medicine

## 2017-02-09 ENCOUNTER — Encounter (HOSPITAL_COMMUNITY): Payer: Self-pay | Admitting: Emergency Medicine

## 2017-02-09 ENCOUNTER — Emergency Department (HOSPITAL_COMMUNITY): Payer: Medicaid Other

## 2017-02-09 ENCOUNTER — Emergency Department (HOSPITAL_COMMUNITY)
Admission: EM | Admit: 2017-02-09 | Discharge: 2017-02-09 | Disposition: A | Discharge status: Home / Self Care | Payer: Medicaid Other | Attending: Emergency Medicine | Admitting: Emergency Medicine

## 2017-02-09 DIAGNOSIS — Z87891 Personal history of nicotine dependence: Secondary | ICD-10-CM | POA: Diagnosis not present

## 2017-02-09 DIAGNOSIS — R1030 Lower abdominal pain, unspecified: Secondary | ICD-10-CM | POA: Insufficient documentation

## 2017-02-09 DIAGNOSIS — Z79899 Other long term (current) drug therapy: Secondary | ICD-10-CM | POA: Insufficient documentation

## 2017-02-09 LAB — CBC
HEMATOCRIT: 41.9 % (ref 39.0–52.0)
HEMOGLOBIN: 15.5 g/dL (ref 13.0–17.0)
MCH: 31.3 pg (ref 26.0–34.0)
MCHC: 37 g/dL — ABNORMAL HIGH (ref 30.0–36.0)
MCV: 84.6 fL (ref 78.0–100.0)
Platelets: 366 10*3/uL (ref 150–400)
RBC: 4.95 MIL/uL (ref 4.22–5.81)
RDW: 12.7 % (ref 11.5–15.5)
WBC: 15 10*3/uL — AB (ref 4.0–10.5)

## 2017-02-09 LAB — COMPREHENSIVE METABOLIC PANEL
ALBUMIN: 4.6 g/dL (ref 3.5–5.0)
ALT: 28 U/L (ref 17–63)
ANION GAP: 11 (ref 5–15)
AST: 29 U/L (ref 15–41)
Alkaline Phosphatase: 95 U/L (ref 38–126)
BILIRUBIN TOTAL: 1.2 mg/dL (ref 0.3–1.2)
BUN: 9 mg/dL (ref 6–20)
CO2: 26 mmol/L (ref 22–32)
Calcium: 9.9 mg/dL (ref 8.9–10.3)
Chloride: 101 mmol/L (ref 101–111)
Creatinine, Ser: 1.09 mg/dL (ref 0.61–1.24)
Glucose, Bld: 123 mg/dL — ABNORMAL HIGH (ref 65–99)
POTASSIUM: 3.6 mmol/L (ref 3.5–5.1)
Sodium: 138 mmol/L (ref 135–145)
TOTAL PROTEIN: 8.8 g/dL — AB (ref 6.5–8.1)

## 2017-02-09 LAB — LIPASE, BLOOD: Lipase: 16 U/L (ref 11–51)

## 2017-02-09 LAB — I-STAT TROPONIN, ED: Troponin i, poc: 0 ng/mL (ref 0.00–0.08)

## 2017-02-09 MED ORDER — SODIUM CHLORIDE 0.9 % IV BOLUS (SEPSIS)
2000.0000 mL | Freq: Once | INTRAVENOUS | Status: AC
  Administered 2017-02-09: 2000 mL via INTRAVENOUS

## 2017-02-09 MED ORDER — IOPAMIDOL (ISOVUE-300) INJECTION 61%
INTRAVENOUS | Status: AC
  Filled 2017-02-09: qty 100

## 2017-02-09 MED ORDER — MORPHINE SULFATE (PF) 2 MG/ML IV SOLN
6.0000 mg | Freq: Once | INTRAVENOUS | Status: AC
  Administered 2017-02-09: 6 mg via INTRAVENOUS
  Filled 2017-02-09: qty 3

## 2017-02-09 MED ORDER — ONDANSETRON HCL 4 MG/2ML IJ SOLN
4.0000 mg | Freq: Once | INTRAMUSCULAR | Status: AC
  Administered 2017-02-09: 4 mg via INTRAVENOUS
  Filled 2017-02-09: qty 2

## 2017-02-09 MED ORDER — SODIUM CHLORIDE 0.9 % IV SOLN: INTRAVENOUS | Status: DC

## 2017-02-09 MED ORDER — IOPAMIDOL (ISOVUE-300) INJECTION 61%
100.0000 mL | Freq: Once | INTRAVENOUS | Status: AC | PRN
  Administered 2017-02-09: 100 mL via INTRAVENOUS

## 2017-02-09 NOTE — ED Provider Notes (Signed)
[facility]-EMERGENCY DEPT Provider Note   CSN: [id number] Arrival date & time: 02/09/17  1905     History   Chief Complaint Chief Complaint  Patient presents with  . Chest Pain  . Emesis    HPI Joshua Baldwin is a 55 y.o. male.  56 year old male with history of diverticulitis presents with 3 days of bilateral lower abdominal pain with associated nonbilious emesis. Denies any black or bloody stools. No fever or chills. Pain is characterized as sharp and is now radiating up into his chest. Denies any anginal type symptoms. No urinary symptoms at this time. Symptoms have been progressively worse and no treatment used them prior to arrival.      Past Medical History:  Diagnosis Date  . DDD (degenerative disc disease), lumbar   . GERD (gastroesophageal reflux disease)   . Lung abnormality    ?cyst of lung recently seen on back film told by dr Gerlene Fee  . Nicotine dependence   . Osteoarthritis    Hips    Patient Active Problem List   Diagnosis Date Noted  . GERD (gastroesophageal reflux disease) 10/25/2016  . Chronic low back pain 10/25/2016  . Primary osteoarthritis of right hip 10/25/2016  . Early diverticulitis of the colon 04/23/2016  . AKI (acute kidney injury) (HCC) 04/21/2016  . Leukocytosis 04/21/2016  . Nausea and vomiting 04/21/2016  . Hypokalemia 04/21/2016  . Liver cysts 04/21/2016  . Acute renal failure (HCC) 04/21/2016  . 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 BACK SURGERY  04       Home Medications    Prior to Admission medications   Medication Sig Start Date End Date Taking? Authorizing Provider  albuterol (PROVENTIL HFA;VENTOLIN HFA) 108 (90 BASE) MCG/ACT inhaler Inhale 1-2 puffs into the lungs every 6 (six) hours as needed for wheezing or shortness of breath. Patient not taking: Reported on 08/18/2016 10/21/14   Joycie Peek, PA-C    HYDROcodone-acetaminophen (NORCO/VICODIN) 5-325 MG tablet Take 2 tablets by mouth every 4 (four) hours as needed. 06/09/16   Cartner, Sharlet Salina, PA-C  ibuprofen (ADVIL,MOTRIN) 800 MG tablet Take 400 mg by mouth every 8 (eight) hours as needed for mild pain or moderate pain.    [provider]  omeprazole (PRILOSEC) 20 MG capsule Take 1 capsule (20 mg total) by mouth daily before breakfast. 10/05/16   Iva Boop, MD  ondansetron (ZOFRAN ODT) 4 MG disintegrating tablet Take 1 tablet (4 mg total) by mouth every 8 (eight) hours as needed for nausea or vomiting. 11/02/16   Jacalyn Lefevre, MD  ondansetron (ZOFRAN) 4 MG tablet Take 1 tablet (4 mg total) by mouth every 8 (eight) hours as needed for nausea or vomiting. 08/18/16   Mackuen, Courteney Lyn, MD  sucralfate (CARAFATE) 1 GM/10ML suspension Take 1 g by mouth 4 (four) times daily as needed (stomach coating).     [provider]  tiZANidine (ZANAFLEX) 4 MG tablet Take 4 mg by mouth 2 (two) times daily as needed for muscle spasms. 06/01/16   [provider]  traMADol (ULTRAM) 50 MG tablet Take 50-100 mg by mouth 2 (two) times daily as needed for pain. 05/24/16   [provider]    Family History Family History  Problem Relation Age of Onset  . Other Father        died of work related accident    Social History Social History  Substance Use Topics  .  Smoking status: Former Smoker    Packs/day: 0.25    Years: 10.00    Types: Cigarettes  . Smokeless tobacco: Never Used  . Alcohol use No     Allergies   Gadolinium derivatives and Mrv [mixed respiratory bacterial vaccine]   Review of Systems Review of Systems  All other systems reviewed and are negative.    Physical Exam Updated Vital Signs BP (!) 132/99 (BP Location: Right Arm)   Pulse 89   Temp 98.4 F (36.9 C) (Oral)   Resp (!) 26   SpO2 100%   Physical Exam  Constitutional: He is oriented to person, place, and time. He appears  well-developed and well-nourished.  Non-toxic appearance. No distress.  HENT:  Head: Normocephalic and atraumatic.  Eyes: Conjunctivae, EOM and lids are normal. Pupils are equal, round, and reactive to light.  Neck: Normal range of motion. Neck supple. No tracheal deviation present. No thyroid mass present.  Cardiovascular: Normal rate, regular rhythm and normal heart sounds.  Exam reveals no gallop.   No murmur heard. Pulmonary/Chest: Effort normal and breath sounds normal. No stridor. No respiratory distress. He has no decreased breath sounds. He has no wheezes. He has no rhonchi. He has no rales.  Abdominal: Soft. Normal appearance and bowel sounds are normal. He exhibits no distension. There is tenderness in the right lower quadrant and left lower quadrant. There is guarding. There is no rigidity, no rebound and no CVA tenderness.    Musculoskeletal: Normal range of motion. He exhibits no edema or tenderness.  Neurological: He is alert and oriented to person, place, and time. He has normal strength. No cranial nerve deficit or sensory deficit. GCS eye subscore is 4. GCS verbal subscore is 5. GCS motor subscore is 6.  Skin: Skin is warm and dry. No abrasion and no rash noted.  Psychiatric: He has a normal mood and affect. His speech is normal and behavior is normal.  Nursing note and vitals reviewed.    ED Treatments / Results  Labs (all labs ordered are listed, but only abnormal results are displayed) Labs Reviewed  LIPASE, BLOOD  COMPREHENSIVE METABOLIC PANEL  CBC  URINALYSIS, ROUTINE W REFLEX MICROSCOPIC  I-STAT TROPOININ, ED    EKG  EKG Interpretation  Date/Time:  Wednesday February 09 2017 19:20:54 EDT Ventricular Rate:  87 PR Interval:    QRS Duration: 96 QT Interval:  347 QTC Calculation: 418 R Axis:   91 Text Interpretation:  Sinus rhythm Consider right atrial enlargement Borderline right axis deviation Confirmed by Lorre Nick ([id number]) on 02/09/2017 7:42:46 PM        Radiology No results found.  Procedures Procedures (including critical care time)  Medications Ordered in ED Medications  sodium chloride 0.9 % bolus 2,000 mL (not administered)  0.9 %  sodium chloride infusion (not administered)  morphine 2 MG/ML injection 6 mg (not administered)  ondansetron (ZOFRAN) injection 4 mg (not administered)     Initial Impression / Assessment and Plan / ED Course  I have reviewed the triage vital signs and the nursing notes.  Pertinent labs & imaging results that were available during my care of the patient were reviewed by me and considered in my medical decision making (see chart for details).     Patient states that his symptoms initially started with lower abdominal pain which she felt was similar to his diverticular attacks. He denies any right hip pain that is either the ordinary for him. No fevers. States that he is scheduled  to have right hip surgery in the future. CT of the abdomen results noted and no signs of diverticular disease and questionable findings in the right gluteus minimus. Patient does not clinically correlate with the findings on CT. He has full range of motion without significant pain. Was treated for his abdominal pain here and on reexam feels much better. Will be discharged home and return precautions given  Final Clinical Impressions(s) / ED Diagnoses   Final diagnoses:  None    New Prescriptions New Prescriptions   No medications on file     Lorre NickAllen, Maliki Gignac, MD 02/09/17 2139

## 2017-02-09 NOTE — ED Triage Notes (Signed)
Pt reports emesis, central CP, and SOB for the past 3 days. Pt reports it feels like when he had diverticulitis.

## 2017-02-11 ENCOUNTER — Encounter (HOSPITAL_COMMUNITY): Payer: Self-pay

## 2017-02-11 ENCOUNTER — Emergency Department (HOSPITAL_COMMUNITY)
Admission: EM | Admit: 2017-02-11 | Discharge: 2017-02-11 | Disposition: A | Discharge status: Home / Self Care | Payer: Medicaid Other | Attending: Emergency Medicine | Admitting: Emergency Medicine

## 2017-02-11 DIAGNOSIS — R1084 Generalized abdominal pain: Secondary | ICD-10-CM | POA: Diagnosis not present

## 2017-02-11 DIAGNOSIS — Z79899 Other long term (current) drug therapy: Secondary | ICD-10-CM | POA: Diagnosis not present

## 2017-02-11 DIAGNOSIS — R112 Nausea with vomiting, unspecified: Secondary | ICD-10-CM | POA: Diagnosis not present

## 2017-02-11 DIAGNOSIS — R197 Diarrhea, unspecified: Secondary | ICD-10-CM | POA: Insufficient documentation

## 2017-02-11 DIAGNOSIS — Z87891 Personal history of nicotine dependence: Secondary | ICD-10-CM | POA: Diagnosis not present

## 2017-02-11 DIAGNOSIS — K047 Periapical abscess without sinus: Secondary | ICD-10-CM | POA: Diagnosis present

## 2017-02-11 LAB — COMPREHENSIVE METABOLIC PANEL
ALT: 26 U/L (ref 17–63)
AST: 34 U/L (ref 15–41)
Albumin: 4.3 g/dL (ref 3.5–5.0)
Alkaline Phosphatase: 98 U/L (ref 38–126)
Anion gap: 11 (ref 5–15)
BUN: 9 mg/dL (ref 6–20)
CHLORIDE: 98 mmol/L — AB (ref 101–111)
CO2: 27 mmol/L (ref 22–32)
Calcium: 9.4 mg/dL (ref 8.9–10.3)
Creatinine, Ser: 0.91 mg/dL (ref 0.61–1.24)
GFR calc non Af Amer: >60 mL/min (ref >60)
Glucose, Bld: 107 mg/dL — ABNORMAL HIGH (ref 65–99)
POTASSIUM: 3.4 mmol/L — AB (ref 3.5–5.1)
Sodium: 136 mmol/L (ref 135–145)
Total Bilirubin: 1.7 mg/dL — ABNORMAL HIGH (ref 0.3–1.2)
Total Protein: 8.6 g/dL — ABNORMAL HIGH (ref 6.5–8.1)

## 2017-02-11 LAB — CBC
HEMATOCRIT: 40.3 % (ref 39.0–52.0)
HEMOGLOBIN: 15 g/dL (ref 13.0–17.0)
MCH: 31.2 pg (ref 26.0–34.0)
MCHC: 37.2 g/dL — ABNORMAL HIGH (ref 30.0–36.0)
MCV: 83.8 fL (ref 78.0–100.0)
Platelets: 330 10*3/uL (ref 150–400)
RBC: 4.81 MIL/uL (ref 4.22–5.81)
RDW: 12.5 % (ref 11.5–15.5)
WBC: 13.4 10*3/uL — AB (ref 4.0–10.5)

## 2017-02-11 LAB — LIPASE, BLOOD: Lipase: 17 U/L (ref 11–51)

## 2017-02-11 MED ORDER — DICYCLOMINE HCL 10 MG/ML IM SOLN
20.0000 mg | Freq: Once | INTRAMUSCULAR | Status: AC
  Administered 2017-02-11: 20 mg via INTRAMUSCULAR
  Filled 2017-02-11: qty 2

## 2017-02-11 MED ORDER — ONDANSETRON HCL 4 MG PO TABS: 4.0000 mg | ORAL_TABLET | Freq: Three times a day (TID) | ORAL | 0 refills | Status: DC | PRN

## 2017-02-11 MED ORDER — SODIUM CHLORIDE 0.9 % IV BOLUS (SEPSIS)
1000.0000 mL | Freq: Once | INTRAVENOUS | Status: AC
  Administered 2017-02-11: 1000 mL via INTRAVENOUS

## 2017-02-11 MED ORDER — DICYCLOMINE HCL 20 MG PO TABS: 20.0000 mg | ORAL_TABLET | Freq: Two times a day (BID) | ORAL | 0 refills | Status: AC

## 2017-02-11 MED ORDER — PENICILLIN V POTASSIUM 500 MG PO TABS: 500.0000 mg | ORAL_TABLET | Freq: Four times a day (QID) | ORAL | 0 refills | Status: DC

## 2017-02-11 MED ORDER — ONDANSETRON HCL 4 MG/2ML IJ SOLN
4.0000 mg | Freq: Once | INTRAMUSCULAR | Status: AC
  Administered 2017-02-11: 4 mg via INTRAVENOUS
  Filled 2017-02-11: qty 2

## 2017-02-11 MED ORDER — PANTOPRAZOLE SODIUM 40 MG IV SOLR
40.0000 mg | Freq: Once | INTRAVENOUS | Status: AC
  Administered 2017-02-11: 40 mg via INTRAVENOUS
  Filled 2017-02-11: qty 40

## 2017-02-11 NOTE — ED Triage Notes (Signed)
Patient c/o abdominal pain, vomiting, diarrhea, and dental pain x 4 days. Patient states he was seen 2 days ago for the same.

## 2017-02-11 NOTE — ED Provider Notes (Signed)
[facility]-EMERGENCY DEPT Provider Note   CSN: [id number] Arrival date & time: 02/11/17  1036     History   Chief Complaint No chief complaint on file.   HPI Joshua Baldwin is a 56 y.o. male.  HPI   Pt p/w N/V/D, diffuse abdominal pain x 4 days and dental abscess x 3 days.  Was seen in ED 2 days ago with negative CT abd/pelvis.  States he has not eated in 4 days.  Vomited 5-6 times today, 4 episodes of diarrhea. No blood in either.  Abdominal pain is central.  Has epigastric burning radiating into the chest that feels like uncontrolled acid reflux.  Denies sick contacts or concerning food exposure.  Is urinating normally.  No SOB or cough.  Taking nexium without relief.    Dental abscess involves pain and swelling including facial swelling.  Slight sore throat.  Denies difficulty swallowing or breathing.  He is feeling hot/cold, subjective fevers.    Past Medical History:  Diagnosis Date  . DDD (degenerative disc disease), lumbar   . GERD (gastroesophageal reflux disease)   . Lung abnormality    ?cyst of lung recently seen on back film told by dr Gerlene Fee  . Nicotine dependence   . Osteoarthritis    Hips    Patient Active Problem List   Diagnosis Date Noted  . GERD (gastroesophageal reflux disease) 10/25/2016  . Chronic low back pain 10/25/2016  . Primary osteoarthritis of right hip 10/25/2016  . Early diverticulitis of the colon 04/23/2016  . AKI (acute kidney injury) (HCC) 04/21/2016  . Leukocytosis 04/21/2016  . Nausea and vomiting 04/21/2016  . Hypokalemia 04/21/2016  . Liver cysts 04/21/2016  . Acute renal failure (HCC) 04/21/2016  . 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 BACK SURGERY  04       Home Medications    Prior to Admission medications   Medication Sig Start Date End Date Taking? Authorizing Provider  ibuprofen (ADVIL,MOTRIN) 800 MG tablet Take 400 mg by  mouth every 8 (eight) hours as needed for mild pain or moderate pain.   Yes [provider]  omeprazole (PRILOSEC) 20 MG capsule Take 1 capsule (20 mg total) by mouth daily before breakfast. 10/05/16  Yes Iva Boop, MD  ondansetron (ZOFRAN ODT) 4 MG disintegrating tablet Take 1 tablet (4 mg total) by mouth every 8 (eight) hours as needed for nausea or vomiting. 11/02/16  Yes Jacalyn Lefevre, MD  Oxycodone HCl 10 MG TABS Take 10 mg by mouth daily as needed.   Yes [provider]  sucralfate (CARAFATE) 1 GM/10ML suspension Take 1 g by mouth 4 (four) times daily as needed (stomach coating).    Yes [provider]  tiZANidine (ZANAFLEX) 4 MG tablet Take 4 mg by mouth 2 (two) times daily as needed for muscle spasms. 06/01/16  Yes [provider]  dicyclomine (BENTYL) 20 MG tablet Take 1 tablet (20 mg total) by mouth 2 (two) times daily. 02/11/17   Trixie Dredge, PA-C  ondansetron (ZOFRAN) 4 MG tablet Take 1 tablet (4 mg total) by mouth every 8 (eight) hours as needed for nausea or vomiting. 02/11/17   Trixie Dredge, PA-C  penicillin v potassium (VEETID) 500 MG tablet Take 1 tablet (500 mg total) by mouth 4 (four) times daily. 02/11/17   Trixie Dredge, PA-C    Family History Family History  Problem Relation Age of Onset  . Other Father  died of work related accident    Social History Social History  Substance Use Topics  . Smoking status: Former Smoker    Packs/day: 0.25    Years: 10.00    Types: Cigarettes  . Smokeless tobacco: Never Used  . Alcohol use No     Allergies   Gadolinium derivatives and Mrv [mixed respiratory bacterial vaccine]   Review of Systems Review of Systems  All other systems reviewed and are negative.    Physical Exam Updated Vital Signs BP (!) 151/100 (BP Location: Right Arm)   Pulse 69   Temp 98.3 F (36.8 C) (Oral)   Resp 16   Ht 5\' 7"  (1.702 m)   Wt 62.6 kg (138 lb)   SpO2 99%   BMI 21.61 kg/m   Physical Exam    Constitutional: He appears well-developed and well-nourished. No distress.  HENT:  Head: Normocephalic and atraumatic.  Mouth/Throat: Uvula is midline and oropharynx is clear and moist. Mucous membranes are not dry. Dental abscesses present.    Neck: Neck supple.  Cardiovascular: Normal rate and regular rhythm.   Pulmonary/Chest: Effort normal and breath sounds normal. No respiratory distress. He has no wheezes. He has no rales.  Abdominal: Soft. He exhibits no distension and no mass. There is generalized tenderness. There is no rebound and no guarding.  Neurological: He is alert. He exhibits normal muscle tone.  Skin: He is not diaphoretic.  Nursing note and vitals reviewed.    ED Treatments / Results  Labs (all labs ordered are listed, but only abnormal results are displayed) Labs Reviewed  COMPREHENSIVE METABOLIC PANEL - Abnormal; Notable for the following:       Result Value   Potassium 3.4 (*)    Chloride 98 (*)    Glucose, Bld 107 (*)    Total Protein 8.6 (*)    Total Bilirubin 1.7 (*)    All other components within normal limits  CBC - Abnormal; Notable for the following:    WBC 13.4 (*)    MCHC 37.2 (*)    All other components within normal limits  LIPASE, BLOOD    EKG  EKG Interpretation None       Radiology Dg Chest 2 View  Result Date: 02/09/2017 CLINICAL DATA:  Central chest pain and shortness of breath. Vomiting. EXAM: CHEST  2 VIEW COMPARISON:  Most recent radiograph 11/02/2017 FINDINGS: The cardiomediastinal contours are normal. The lungs are clear. Pulmonary vasculature is normal. No consolidation, pleural effusion, or pneumothorax. No acute osseous abnormalities are seen. IMPRESSION: No acute abnormality. Electronically Signed   By: Rubye OaksMelanie  Ehinger M.D.   On: 02/09/2017 19:56   Ct Abdomen Pelvis W Contrast  Result Date: 02/09/2017 CLINICAL DATA:  Vomiting. EXAM: CT ABDOMEN AND PELVIS WITH CONTRAST TECHNIQUE: Multidetector CT imaging of the abdomen  and pelvis was performed using the standard protocol following bolus administration of intravenous contrast. CONTRAST:  100mL ISOVUE-300 IOPAMIDOL (ISOVUE-300) INJECTION 61% COMPARISON:  08/18/2016 FINDINGS: Lower chest:  Unremarkable. Hepatobiliary: Multiple hepatic cysts are evident, as on the prior study. Gallbladder is decompressed. No intrahepatic or extrahepatic biliary dilation. Pancreas: No focal mass lesion. No dilatation of the main duct. No intraparenchymal cyst. No peripancreatic edema. Spleen: No splenomegaly. No focal mass lesion. Adrenals/Urinary Tract: No adrenal nodule or mass. Kidneys unremarkable. No evidence for hydroureter. The urinary bladder appears normal for the degree of distention. Stomach/Bowel: Stomach is nondistended. No gastric wall thickening. No evidence of outlet obstruction. Duodenum is normally positioned as is the ligament  of Treitz. No small bowel wall thickening. No small bowel dilatation. Terminal ileum not discretely identified. The appendix is not visualized, but there is no edema or inflammation in the region of the cecum. No gross colonic mass. No colonic wall thickening. No substantial diverticular change. Vascular/Lymphatic: No abdominal aortic aneurysm. There is no gastrohepatic or hepatoduodenal ligament lymphadenopathy. No intraperitoneal or retroperitoneal lymphadenopathy. No pelvic sidewall lymphadenopathy. Reproductive: The prostate gland and seminal vesicles have normal imaging features. Other: No intraperitoneal free fluid. Musculoskeletal: Marked deformity of the right hip, as before with extensive bone loss in the femoral head. The joint is enlarged and there is apparent joint fluid tracking cranially up in the gluteus minimus muscle. These features are similar to prior study. Extensive lumbar fusion evident. IMPRESSION: 1. No acute findings in the abdomen or pelvis. 2. Numerous hepatic cysts, similar to prior study. 3. Patient is status post extensive  posterior lumbar fusion with advanced apparent degenerative change in the right hip, similar to prior. There is a rim enhancing fluid collection in the gluteus minimus muscle just cranial to the right hip, likely reflecting dissection of joint fluid into the muscle. As an intramuscular abscess could present with similar imaging features, correlation for signs/symptoms of joint infection recommended. Electronically Signed   By: Kennith Center M.D.   On: 02/09/2017 20:55    Procedures Procedures (including critical care time)  Medications Ordered in ED Medications  sodium chloride 0.9 % bolus 1,000 mL (0 mLs Intravenous Stopped 02/11/17 1438)  ondansetron (ZOFRAN) injection 4 mg (4 mg Intravenous Given 02/11/17 1142)  dicyclomine (BENTYL) injection 20 mg (20 mg Intramuscular Given 02/11/17 1142)  pantoprazole (PROTONIX) injection 40 mg (40 mg Intravenous Given 02/11/17 1337)  sodium chloride 0.9 % bolus 1,000 mL (0 mLs Intravenous Stopped 02/11/17 1438)     Initial Impression / Assessment and Plan / ED Course  I have reviewed the triage vital signs and the nursing notes.  Pertinent labs & imaging results that were available during my care of the patient were reviewed by me and considered in my medical decision making (see chart for details).  Clinical Course as of Feb 12 1540  Fri Feb 11, 2017  1337 Reexamination of the abdomen remains benign.  Diffuse tenderness.    [EW]    Clinical Course User Index [EW] Chad, [place], [place]    Afebrile, nontoxic patient with diffuse abdominal tenderness, N/V/D x 4 days.  Also with dental abscess.  No e/o ludwig's angina.  No airway concerns.  IVF and symptomatic medications given with improvement.  Repeat abdominal exam unchanged.  Recent CT demonstrated no intraabdominal pathology.  Noted possible gluteal fluid collection on CT and again, this does not seem related to his current problem, doubt infection - pt denies any back or buttock pain.   Pt checked on DEA  database - was getting monthly oxycodone 10mg  but none so far this month.  D/C home with symptomatic treatment, antibiotics for dental infection, oral surgery follow up.  Discussed result, findings, treatment, and follow up  with patient.  Pt given return precautions.  Pt verbalizes understanding and agrees with plan.       Final Clinical Impressions(s) / ED Diagnoses   Final diagnoses:  Dental abscess  Nausea vomiting and diarrhea  Generalized abdominal pain    New Prescriptions Discharge Medication List as of 02/11/2017  1:42 PM    START taking these medications   Details  dicyclomine (BENTYL) 20 MG tablet Take 1 tablet (20 mg total)  by mouth 2 (two) times daily., Starting Fri 02/11/2017, Print    ondansetron (ZOFRAN) 4 MG tablet Take 1 tablet (4 mg total) by mouth every 8 (eight) hours as needed for nausea or vomiting., Starting Fri 02/11/2017, Print    penicillin v potassium (VEETID) 500 MG tablet Take 1 tablet (500 mg total) by mouth 4 (four) times daily., Starting Fri 02/11/2017, Print         Trixie Dredge, PA-C 02/11/17 1545    Benjiman Core, MD 02/12/17 [phone]

## 2017-02-11 NOTE — Discharge Instructions (Signed)
Read the information below.  Use the prescribed medication as directed.  Please discuss all new medications with your pharmacist.  You may return to the Emergency Department at any time for worsening condition or any new symptoms that concern you.     If you develop high fevers, worsening abdominal pain, uncontrolled vomiting, or are unable to tolerate fluids by mouth, return to the ER for a recheck.   Please call the dentist listed above within 48 hours to schedule a close follow up appointment.  If you develop fevers, swelling in your face, difficulty swallowing or breathing, return to the ER immediately for a recheck.

## 2017-04-27 ENCOUNTER — Encounter: Payer: Self-pay | Admitting: Internal Medicine

## 2017-05-23 NOTE — H&P (Signed)
PREOPERATIVE H&P Patient ID: Joshua Baldwin MRN: [id number] DOB/AGE: 1960/10/17 56 y.o.  Chief Complaint: OA RIGHT HIP  Planned Procedure Date: 06/14/17 Medical and Cardiac Clearance by Dr. Marcy Salvo   Additional clearance by GI: Dr. Leone Payor Pain Management: Dr. Roselie Awkward (recently dismissed)  HPI: Joshua Baldwin is a 56 y.o. 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 Darrelyn Hillock back in 2005, and later in 2015 by Dr. Gerlene Fee for continued back pain.  He has been followed by Dr. Yetta Barre for bilateral radiating lumbago with planned additional 2 level fusion with hardware removal, but was referred to [place] to address his hip problem first.  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 The patient has a history of pain and functional disability in the right hip due to arthritis and has failed non-surgical conservative treatments for greater than 12 weeks to include NSAID's and/or analgesics, use of assistive devices and activity modification.  Onset of symptoms was gradual, starting 5 years ago with gradually worsening course since that time.  Patient currently rates pain at 10 out of 10 with activity. Patient has night pain, worsening of pain with activity and weight bearing and pain that interferes with activities of daily living.   Patient has evidence of subchondral cysts, subchondral sclerosis, periarticular osteophytes and joint space narrowing by imaging studies. There is no active infection.  Past Medical History:  Diagnosis Date  . DDD (degenerative disc disease), lumbar   . GERD (gastroesophageal reflux disease)   . Lung abnormality    ?cyst of lung recently seen on back film told by dr Gerlene Fee  . Nicotine dependence   . Osteoarthritis    Hips   Past Surgical  History:  Procedure Laterality Date  . BACK SURGERY  04   Allergies  Allergen Reactions  . Gadolinium Derivatives Shortness Of Breath and Nausea And Vomiting  . Mrv [Mixed Respiratory Bacterial Vaccine] Other (See Comments)    Unable to recall reaction- unknown   Medications: Tizanidine 4 mg BID Tramadol 50 mg q 6 hr Omeprazole 20 mg BID  Carafate 1 gm / 10 mL TID  Social History   Social History  . Marital status: Married    Spouse name: N/A  . Number of children: N/A  . Years of education: N/A   Social History Main Topics  . Smoking status: Former Smoker    Packs/day: 0.25    Years: 10.00    Types: Cigarettes  . Smokeless tobacco: Never Used  . Alcohol use No  . Drug use: No  . Sexual activity: Not on file   Other Topics Concern  . Not on file   Social History Narrative   Married   1 son 2 daughters   Not working ? Disabled   Played college baseball - injuries related to that   1 caffeine/qd   Family History  Problem Relation Age of Onset  . Other Father        died of work related accident    ROS: Currently denies lightheadedness, dizziness, Fever, chills, CP, SOB.   No personal history of DVT, PE, MI, or CVA. No loose teeth or dentures All other systems have been reviewed and were otherwise currently negative with the exception of those mentioned in the HPI and as above.  Objective: Vitals: Ht:  5'5" Wt: 139 Temp: 97.9 BP: 123/77 Pulse: 69 O2 98% on room air. Physical Exam: General: Alert, NAD. Trendelenberg Gait  HEENT: EOMI, Good Neck Extension  Pulm: No increased work of breathing.  Clear B/L A/P w/o crackle or wheeze.  CV: RRR, No m/g/r appreciated  GI: soft, NT, ND Neuro: Neuro without gross focal deficit.  Sensation intact distally Skin: No lesions in the area of chief complaint MSK/Surgical Site: Right Hip Non tender over greater trochanter.  Pain with passive ROM.  Positive Stinchfield.  5/5 strength.  NVI.  Sensation intact  distally.  Imaging Review Plain radiographs right hip demonstrate severe wear with superior migration and destruction of the femoral head eroding into the superior acetabulum with shortening.  Two views of the right knee shows low riding patella consistent with previous quad tendon rupture.  Appears to be catching in the anterior aspect of the joint.    Assessment: OA RIGHT HIP Active Problems:   GERD (gastroesophageal reflux disease)   Chronic low back pain   Primary osteoarthritis of right hip   Plan: Plan for Procedure(s): RIGHT TOTAL HIP ARTHROPLASTY ANTERIOR APPROACH  The patient history, physical exam, clinical judgement of the provider and imaging are consistent with end stage degenerative joint disease and total joint arthroplasty is deemed medically necessary. The treatment options including medical management, injection therapy, and arthroplasty were discussed at length. The risks and benefits of Procedure(s): RIGHT TOTAL HIP ARTHROPLASTY ANTERIOR APPROACH were presented and reviewed.  The risks of nonoperative treatment, versus surgical intervention including but not limited to continued pain, aseptic loosening, stiffness, dislocation/subluxation, infection, bleeding, nerve injury, blood clots, cardiopulmonary complications, morbidity, mortality, among others were discussed. The patient verbalizes understanding and wishes to proceed with the plan.  Patient is being admitted for inpatient treatment for surgery, pain control, PT, OT, prophylactic antibiotics, VTE prophylaxis, progressive ambulation, ADL's and discharge planning.   Dental prophylaxis discussed and recommended for 2 years postoperatively.   The patient does meet the criteria for TXA which will be used perioperatively via IV.    Xarelto will be used postoperatively for DVT prophylaxis in addition to SCDs, and early ambulation.  H/O bleeding ulcer remotely from etoh.  Current severe GERD.  The patient is planning to  be discharged home with home health services ([facility]) in care of his Wife.  Lucretia Kern Martensen III, PA-C 05/23/2017 8:25 AM

## 2017-06-01 NOTE — Pre-Procedure Instructions (Signed)
    Joshua Baldwin  06/01/2017      [facility] [id number] [place] - [address] AT [facility] [address] [place] [id number] Phone: [phone] Fax: [phone]    Your procedure is scheduled on Tuesday, June 14, 2017  [facility] at 5:30 A.M.  Call this number if you have problems the morning of surgery:  [phone]   Remember:  Do not eat food or drink liquids after midnight Monday, June 13, 2017  Take these medicines the morning of surgery with A SIP OF WATER :dicyclomine (BENTYL),  omeprazole (PRILOSEC),traMADol (ULTRAM). Stop taking Aspirin,vitamins, fish oil and herbal medications. Do not take any NSAIDs ie: Ibuprofen, Advil, Naproxen (Aleve), Motrin, BC and Goody Powder or any medication containing Aspirin; stop Tuesday, June 07, 2017  Do not wear jewelry, make-up or nail polish.  Do not wear lotions, powders, or perfumes, or deoderant.  Do not shave 48 hours prior to surgery.  Men may shave face and neck.  Do not bring valuables to the hospital.  [facility] is not responsible for any belongings or valuables. Contacts, dentures or bridgework may not be worn into surgery.  Leave your suitcase in the car.  After surgery it may be brought to your room. For patients admitted to the hospital, discharge time will be determined by your treatment team. Special instructions: Shower the night before surgery and the morning of surgery with CHG. Please read over the following fact sheets that you were given. Pain Booklet, Coughing and Deep Breathing, Total Joint Packet, MRSA Information and Surgical Site Infection Prevention

## 2017-06-02 ENCOUNTER — Encounter (HOSPITAL_COMMUNITY)
Admission: RE | Admit: 2017-06-02 | Discharge: 2017-06-02 | Disposition: A | Discharge status: Home / Self Care | Payer: Medicaid Other | Source: Ambulatory Visit | Attending: Orthopedic Surgery | Admitting: Orthopedic Surgery

## 2017-06-02 ENCOUNTER — Encounter (HOSPITAL_COMMUNITY): Payer: Self-pay

## 2017-06-02 DIAGNOSIS — Z01818 Encounter for other preprocedural examination: Secondary | ICD-10-CM | POA: Insufficient documentation

## 2017-06-02 DIAGNOSIS — M1611 Unilateral primary osteoarthritis, right hip: Secondary | ICD-10-CM | POA: Insufficient documentation

## 2017-06-02 HISTORY — DX: Pneumonia, unspecified organism: J18.9

## 2017-06-02 LAB — BASIC METABOLIC PANEL
ANION GAP: 7 (ref 5–15)
BUN: 7 mg/dL (ref 6–20)
CALCIUM: 9.1 mg/dL (ref 8.9–10.3)
CO2: 28 mmol/L (ref 22–32)
Chloride: 102 mmol/L (ref 101–111)
Creatinine, Ser: 0.95 mg/dL (ref 0.61–1.24)
Glucose, Bld: 99 mg/dL (ref 65–99)
Potassium: 3.9 mmol/L (ref 3.5–5.1)
SODIUM: 137 mmol/L (ref 135–145)

## 2017-06-02 LAB — CBC
HCT: 39.2 % (ref 39.0–52.0)
HEMOGLOBIN: 13.7 g/dL (ref 13.0–17.0)
MCH: 30.6 pg (ref 26.0–34.0)
MCHC: 34.9 g/dL (ref 30.0–36.0)
MCV: 87.5 fL (ref 78.0–100.0)
PLATELETS: 258 10*3/uL (ref 150–400)
RBC: 4.48 MIL/uL (ref 4.22–5.81)
RDW: 13.2 % (ref 11.5–15.5)
WBC: 7.8 10*3/uL (ref 4.0–10.5)

## 2017-06-02 LAB — SURGICAL PCR SCREEN
MRSA, PCR: NEGATIVE
STAPHYLOCOCCUS AUREUS: POSITIVE — AB

## 2017-06-02 NOTE — Progress Notes (Signed)
Pt. Reports feeling well, denies all chest, breathing concerns. Pt. Has routine PCP visit schedule for tomorrow- 10/26. Pt. Followed by Dr. Lenna SciaraAvbere at [facility]. Pt. Denies any stress test or echo in the past.

## 2017-06-13 MED ORDER — TRANEXAMIC ACID 1000 MG/10ML IV SOLN
1000.0000 mg | INTRAVENOUS | Status: AC
  Administered 2017-06-14: 1000 mg via INTRAVENOUS
  Filled 2017-06-13 (×2): qty 10

## 2017-06-13 NOTE — Anesthesia Preprocedure Evaluation (Signed)
Anesthesia Evaluation  Patient identified by MRN, date of birth, ID band Patient awake    Reviewed: Allergy & Precautions, H&P , NPO status , Patient's Chart, lab work & pertinent test results, reviewed documented beta blocker date and time   History of Anesthesia Complications Negative for: history of anesthetic complications  Airway Mallampati: II  TM Distance: >3 FB Neck ROM: Full    Dental no notable dental hx. (+) Dental Advisory Given, Partial Upper   Pulmonary Current Smoker, former smoker,    Pulmonary exam normal breath sounds clear to auscultation       Cardiovascular negative cardio ROS Normal cardiovascular exam Rhythm:Regular Rate:Normal     Neuro/Psych Chronic back pain: narcotic dependent    GI/Hepatic negative GI ROS, Neg liver ROS, GERD  ,  Endo/Other  negative endocrine ROS  Renal/GU negative Renal ROS     Musculoskeletal   Abdominal   Peds  Hematology negative hematology ROS (+)   Anesthesia Other Findings   Reproductive/Obstetrics                             Anesthesia Physical  Anesthesia Plan  ASA: II  Anesthesia Plan:    Post-op Pain Management:    Induction: Intravenous  PONV Risk Score and Plan: 1  Airway Management Planned: Oral ETT  Additional Equipment:   Intra-op Plan:   Post-operative Plan: Extubation in OR  Informed Consent: I have reviewed the patients History and Physical, chart, labs and discussed the procedure including the risks, benefits and alternatives for the proposed anesthesia with the patient or authorized representative who has indicated his/her understanding and acceptance.   Dental advisory given  Plan Discussed with: CRNA, Surgeon and Anesthesiologist  Anesthesia Plan Comments: (Plan routine monitors, GETA over SAB   Hx of back surg)        Anesthesia Quick Evaluation

## 2017-06-14 ENCOUNTER — Encounter (HOSPITAL_COMMUNITY): Payer: Self-pay | Admitting: Urology

## 2017-06-14 ENCOUNTER — Encounter (HOSPITAL_COMMUNITY)
Admission: RE | Disposition: A | Discharge status: Home Health | Payer: Self-pay | Source: Ambulatory Visit | Attending: Orthopedic Surgery

## 2017-06-14 ENCOUNTER — Inpatient Hospital Stay (HOSPITAL_COMMUNITY)
Admission: RE | Admit: 2017-06-14 | Discharge: 2017-06-15 | DRG: 470 | Disposition: A | Discharge status: Home Health | Payer: Medicaid Other | Source: Ambulatory Visit | Attending: Orthopedic Surgery | Admitting: Orthopedic Surgery

## 2017-06-14 ENCOUNTER — Inpatient Hospital Stay (HOSPITAL_COMMUNITY): Payer: Medicaid Other | Admitting: Anesthesiology

## 2017-06-14 ENCOUNTER — Inpatient Hospital Stay (HOSPITAL_COMMUNITY): Payer: Medicaid Other

## 2017-06-14 DIAGNOSIS — Z87891 Personal history of nicotine dependence: Secondary | ICD-10-CM | POA: Diagnosis not present

## 2017-06-14 DIAGNOSIS — Z91041 Radiographic dye allergy status: Secondary | ICD-10-CM

## 2017-06-14 DIAGNOSIS — F1010 Alcohol abuse, uncomplicated: Secondary | ICD-10-CM | POA: Diagnosis present

## 2017-06-14 DIAGNOSIS — Z419 Encounter for procedure for purposes other than remedying health state, unspecified: Secondary | ICD-10-CM

## 2017-06-14 DIAGNOSIS — Z887 Allergy status to serum and vaccine status: Secondary | ICD-10-CM | POA: Diagnosis not present

## 2017-06-14 DIAGNOSIS — D62 Acute posthemorrhagic anemia: Secondary | ICD-10-CM | POA: Diagnosis not present

## 2017-06-14 DIAGNOSIS — M160 Bilateral primary osteoarthritis of hip: Principal | ICD-10-CM | POA: Diagnosis present

## 2017-06-14 DIAGNOSIS — Z981 Arthrodesis status: Secondary | ICD-10-CM

## 2017-06-14 DIAGNOSIS — G8929 Other chronic pain: Secondary | ICD-10-CM | POA: Diagnosis present

## 2017-06-14 DIAGNOSIS — K219 Gastro-esophageal reflux disease without esophagitis: Secondary | ICD-10-CM | POA: Diagnosis present

## 2017-06-14 DIAGNOSIS — M545 Low back pain, unspecified: Secondary | ICD-10-CM | POA: Diagnosis present

## 2017-06-14 DIAGNOSIS — Z96649 Presence of unspecified artificial hip joint: Secondary | ICD-10-CM

## 2017-06-14 DIAGNOSIS — M1611 Unilateral primary osteoarthritis, right hip: Secondary | ICD-10-CM | POA: Diagnosis present

## 2017-06-14 DIAGNOSIS — M25551 Pain in right hip: Secondary | ICD-10-CM | POA: Diagnosis present

## 2017-06-14 DIAGNOSIS — M797 Fibromyalgia: Secondary | ICD-10-CM | POA: Diagnosis present

## 2017-06-14 HISTORY — PX: TOTAL HIP ARTHROPLASTY: SHX124

## 2017-06-14 SURGERY — ARTHROPLASTY, HIP, TOTAL, ANTERIOR APPROACH
Anesthesia: Choice | Site: Hip | Laterality: Right

## 2017-06-14 MED ORDER — CEFAZOLIN SODIUM-DEXTROSE 2-4 GM/100ML-% IV SOLN
INTRAVENOUS | Status: AC
  Filled 2017-06-14: qty 100

## 2017-06-14 MED ORDER — FENTANYL CITRATE (PF) 250 MCG/5ML IJ SOLN
INTRAMUSCULAR | Status: AC
  Filled 2017-06-14: qty 5

## 2017-06-14 MED ORDER — FENTANYL CITRATE (PF) 100 MCG/2ML IJ SOLN
INTRAMUSCULAR | Status: AC
  Filled 2017-06-14: qty 2

## 2017-06-14 MED ORDER — LIDOCAINE HCL (CARDIAC) 20 MG/ML IV SOLN
INTRAVENOUS | Status: DC | PRN
  Administered 2017-06-14: 80 mg via INTRAVENOUS

## 2017-06-14 MED ORDER — 0.9 % SODIUM CHLORIDE (POUR BTL) OPTIME
TOPICAL | Status: DC | PRN
  Administered 2017-06-14: 1000 mL

## 2017-06-14 MED ORDER — DEXTROSE 5 % IV SOLN
500.0000 mg | Freq: Four times a day (QID) | INTRAVENOUS | Status: DC | PRN
  Filled 2017-06-14: qty 5

## 2017-06-14 MED ORDER — SUCRALFATE 1 GM/10ML PO SUSP
1.0000 g | Freq: Two times a day (BID) | ORAL | Status: DC
  Administered 2017-06-14: 1 g via ORAL
  Filled 2017-06-14 (×2): qty 10

## 2017-06-14 MED ORDER — BUPIVACAINE-EPINEPHRINE (PF) 0.25% -1:200000 IJ SOLN
INTRAMUSCULAR | Status: AC
  Filled 2017-06-14: qty 30

## 2017-06-14 MED ORDER — ACETAMINOPHEN 500 MG PO TABS
ORAL_TABLET | ORAL | Status: AC
  Administered 2017-06-14: 1000 mg via ORAL
  Filled 2017-06-14: qty 2

## 2017-06-14 MED ORDER — SODIUM CHLORIDE FLUSH 0.9 % IV SOLN
INTRAVENOUS | Status: DC | PRN
  Administered 2017-06-14 (×3): 10 mL

## 2017-06-14 MED ORDER — ONDANSETRON HCL 4 MG/2ML IJ SOLN: 4.0000 mg | Freq: Four times a day (QID) | INTRAMUSCULAR | Status: DC | PRN

## 2017-06-14 MED ORDER — GABAPENTIN 300 MG PO CAPS
300.0000 mg | ORAL_CAPSULE | Freq: Once | ORAL | Status: AC
  Administered 2017-06-14: 300 mg via ORAL

## 2017-06-14 MED ORDER — DICYCLOMINE HCL 20 MG PO TABS
20.0000 mg | ORAL_TABLET | Freq: Two times a day (BID) | ORAL | Status: DC
  Administered 2017-06-14 – 2017-06-15 (×2): 20 mg via ORAL
  Filled 2017-06-14 (×2): qty 1

## 2017-06-14 MED ORDER — POLYETHYLENE GLYCOL 3350 17 G PO PACK: 17.0000 g | PACK | Freq: Every day | ORAL | Status: DC | PRN

## 2017-06-14 MED ORDER — METHOCARBAMOL 500 MG PO TABS
500.0000 mg | ORAL_TABLET | Freq: Four times a day (QID) | ORAL | Status: DC | PRN
  Administered 2017-06-14 – 2017-06-15 (×3): 500 mg via ORAL
  Filled 2017-06-14 (×2): qty 1

## 2017-06-14 MED ORDER — PROPOFOL 10 MG/ML IV BOLUS
INTRAVENOUS | Status: AC
  Filled 2017-06-14: qty 20

## 2017-06-14 MED ORDER — LACTATED RINGERS IV SOLN
INTRAVENOUS | Status: DC
  Administered 2017-06-14 (×2): via INTRAVENOUS

## 2017-06-14 MED ORDER — METHOCARBAMOL 500 MG PO TABS: 500.0000 mg | ORAL_TABLET | Freq: Four times a day (QID) | ORAL | 0 refills | Status: AC | PRN

## 2017-06-14 MED ORDER — KETOROLAC TROMETHAMINE 15 MG/ML IJ SOLN
INTRAMUSCULAR | Status: AC
  Filled 2017-06-14: qty 1

## 2017-06-14 MED ORDER — TRANEXAMIC ACID 1000 MG/10ML IV SOLN
INTRAVENOUS | Status: AC | PRN
  Administered 2017-06-14: 2000 mg via TOPICAL

## 2017-06-14 MED ORDER — OXYCODONE-ACETAMINOPHEN 5-325 MG PO TABS: 1.0000 | ORAL_TABLET | ORAL | 0 refills | Status: AC | PRN

## 2017-06-14 MED ORDER — CEFAZOLIN SODIUM-DEXTROSE 2-4 GM/100ML-% IV SOLN
2.0000 g | INTRAVENOUS | Status: AC
  Administered 2017-06-14: 2 g via INTRAVENOUS

## 2017-06-14 MED ORDER — OXYCODONE HCL 5 MG PO TABS
5.0000 mg | ORAL_TABLET | ORAL | Status: DC | PRN
  Administered 2017-06-14 – 2017-06-15 (×2): 5 mg via ORAL

## 2017-06-14 MED ORDER — DIPHENHYDRAMINE HCL 12.5 MG/5ML PO ELIX
12.5000 mg | ORAL_SOLUTION | ORAL | Status: DC | PRN
Start: 2017-06-14 — End: 2017-06-15

## 2017-06-14 MED ORDER — METHOCARBAMOL 500 MG PO TABS
ORAL_TABLET | ORAL | Status: AC
  Filled 2017-06-14: qty 1

## 2017-06-14 MED ORDER — DEXAMETHASONE SODIUM PHOSPHATE 4 MG/ML IJ SOLN
INTRAMUSCULAR | Status: DC | PRN
  Administered 2017-06-14: 10 mg via INTRAVENOUS

## 2017-06-14 MED ORDER — ONDANSETRON HCL 4 MG PO TABS: 4.0000 mg | ORAL_TABLET | Freq: Four times a day (QID) | ORAL | Status: DC | PRN

## 2017-06-14 MED ORDER — ONDANSETRON HCL 4 MG PO TABS: 4.0000 mg | ORAL_TABLET | Freq: Three times a day (TID) | ORAL | 0 refills | Status: AC | PRN

## 2017-06-14 MED ORDER — KETOROLAC TROMETHAMINE 30 MG/ML IJ SOLN
INTRAMUSCULAR | Status: AC
  Filled 2017-06-14: qty 2

## 2017-06-14 MED ORDER — PHENOL 1.4 % MT LIQD: 1.0000 | OROMUCOSAL | Status: DC | PRN

## 2017-06-14 MED ORDER — TRANEXAMIC ACID 1000 MG/10ML IV SOLN
2000.0000 mg | Freq: Once | INTRAVENOUS | Status: DC
  Filled 2017-06-14: qty 20

## 2017-06-14 MED ORDER — CEFAZOLIN SODIUM-DEXTROSE 1-4 GM/50ML-% IV SOLN
1.0000 g | Freq: Four times a day (QID) | INTRAVENOUS | Status: AC
  Administered 2017-06-14 (×2): 1 g via INTRAVENOUS
  Filled 2017-06-14: qty 50

## 2017-06-14 MED ORDER — PANTOPRAZOLE SODIUM 40 MG PO TBEC
40.0000 mg | DELAYED_RELEASE_TABLET | Freq: Every day | ORAL | Status: DC
  Administered 2017-06-14 – 2017-06-15 (×2): 40 mg via ORAL
  Filled 2017-06-14 (×2): qty 1

## 2017-06-14 MED ORDER — HYDROMORPHONE HCL 1 MG/ML IJ SOLN
0.5000 mg | INTRAMUSCULAR | Status: DC | PRN
  Administered 2017-06-14 (×2): 1 mg via INTRAVENOUS
  Filled 2017-06-14: qty 1

## 2017-06-14 MED ORDER — CHLORHEXIDINE GLUCONATE 4 % EX LIQD: 60.0000 mL | Freq: Once | CUTANEOUS | Status: DC

## 2017-06-14 MED ORDER — PROPOFOL 10 MG/ML IV BOLUS
INTRAVENOUS | Status: DC | PRN
  Administered 2017-06-14: 150 mg via INTRAVENOUS

## 2017-06-14 MED ORDER — OXYCODONE HCL 5 MG PO TABS
10.0000 mg | ORAL_TABLET | ORAL | Status: DC | PRN
  Administered 2017-06-14 (×2): 10 mg via ORAL
  Filled 2017-06-14 (×3): qty 2

## 2017-06-14 MED ORDER — MIDAZOLAM HCL 2 MG/2ML IJ SOLN
INTRAMUSCULAR | Status: AC
  Filled 2017-06-14: qty 2

## 2017-06-14 MED ORDER — ONDANSETRON HCL 4 MG/2ML IJ SOLN
INTRAMUSCULAR | Status: DC | PRN
  Administered 2017-06-14: 4 mg via INTRAVENOUS

## 2017-06-14 MED ORDER — ACETAMINOPHEN 650 MG RE SUPP: 650.0000 mg | RECTAL | Status: DC | PRN

## 2017-06-14 MED ORDER — KETOROLAC TROMETHAMINE 30 MG/ML IJ SOLN
INTRAMUSCULAR | Status: DC | PRN
  Administered 2017-06-14: 30 mg via INTRA_ARTICULAR

## 2017-06-14 MED ORDER — FENTANYL CITRATE (PF) 100 MCG/2ML IJ SOLN
INTRAMUSCULAR | Status: DC | PRN
  Administered 2017-06-14: 100 ug via INTRAVENOUS
  Administered 2017-06-14 (×2): 50 ug via INTRAVENOUS
  Administered 2017-06-14 (×3): 100 ug via INTRAVENOUS

## 2017-06-14 MED ORDER — SENNA 8.6 MG PO TABS
1.0000 | ORAL_TABLET | Freq: Two times a day (BID) | ORAL | Status: DC
  Administered 2017-06-14 – 2017-06-15 (×2): 8.6 mg via ORAL
  Filled 2017-06-14 (×2): qty 1

## 2017-06-14 MED ORDER — CEFAZOLIN SODIUM-DEXTROSE 1-4 GM/50ML-% IV SOLN
INTRAVENOUS | Status: AC
  Filled 2017-06-14: qty 50

## 2017-06-14 MED ORDER — SUGAMMADEX SODIUM 200 MG/2ML IV SOLN
INTRAVENOUS | Status: DC | PRN
  Administered 2017-06-14: 300 mg via INTRAVENOUS

## 2017-06-14 MED ORDER — FLEET ENEMA 7-19 GM/118ML RE ENEM: 1.0000 | ENEMA | Freq: Once | RECTAL | Status: DC | PRN

## 2017-06-14 MED ORDER — HYDROMORPHONE HCL 1 MG/ML IJ SOLN
INTRAMUSCULAR | Status: AC
  Filled 2017-06-14: qty 1

## 2017-06-14 MED ORDER — DEXAMETHASONE SODIUM PHOSPHATE 10 MG/ML IJ SOLN
10.0000 mg | Freq: Once | INTRAMUSCULAR | Status: AC
  Administered 2017-06-15: 10 mg via INTRAVENOUS
  Filled 2017-06-14: qty 1

## 2017-06-14 MED ORDER — METOCLOPRAMIDE HCL 5 MG/ML IJ SOLN: 5.0000 mg | Freq: Three times a day (TID) | INTRAMUSCULAR | Status: DC | PRN

## 2017-06-14 MED ORDER — SORBITOL 70 % SOLN: 30.0000 mL | Freq: Every day | Status: DC | PRN

## 2017-06-14 MED ORDER — ACETAMINOPHEN 500 MG PO TABS
1000.0000 mg | ORAL_TABLET | Freq: Once | ORAL | Status: AC
  Administered 2017-06-14: 1000 mg via ORAL

## 2017-06-14 MED ORDER — MIDAZOLAM HCL 5 MG/5ML IJ SOLN
INTRAMUSCULAR | Status: DC | PRN
  Administered 2017-06-14: 2 mg via INTRAVENOUS

## 2017-06-14 MED ORDER — METOCLOPRAMIDE HCL 5 MG PO TABS: 5.0000 mg | ORAL_TABLET | Freq: Three times a day (TID) | ORAL | Status: DC | PRN

## 2017-06-14 MED ORDER — ALBUTEROL SULFATE (2.5 MG/3ML) 0.083% IN NEBU: 2.5000 mg | INHALATION_SOLUTION | Freq: Four times a day (QID) | RESPIRATORY_TRACT | Status: DC | PRN

## 2017-06-14 MED ORDER — ACETAMINOPHEN 500 MG PO TABS
1000.0000 mg | ORAL_TABLET | Freq: Three times a day (TID) | ORAL | Status: DC
  Administered 2017-06-14 – 2017-06-15 (×3): 1000 mg via ORAL
  Filled 2017-06-14 (×3): qty 2

## 2017-06-14 MED ORDER — DOCUSATE SODIUM 100 MG PO CAPS
100.0000 mg | ORAL_CAPSULE | Freq: Two times a day (BID) | ORAL | Status: DC
  Administered 2017-06-14 – 2017-06-15 (×2): 100 mg via ORAL
  Filled 2017-06-14 (×2): qty 1

## 2017-06-14 MED ORDER — ALBUMIN HUMAN 5 % IV SOLN
INTRAVENOUS | Status: DC | PRN
  Administered 2017-06-14 (×2): via INTRAVENOUS

## 2017-06-14 MED ORDER — ROCURONIUM BROMIDE 100 MG/10ML IV SOLN
INTRAVENOUS | Status: DC | PRN
  Administered 2017-06-14: 50 mg via INTRAVENOUS
  Administered 2017-06-14 (×3): 10 mg via INTRAVENOUS

## 2017-06-14 MED ORDER — RIVAROXABAN 10 MG PO TABS
10.0000 mg | ORAL_TABLET | Freq: Every day | ORAL | Status: DC
  Administered 2017-06-15: 10 mg via ORAL
  Filled 2017-06-14: qty 1

## 2017-06-14 MED ORDER — MENTHOL 3 MG MT LOZG: 1.0000 | LOZENGE | OROMUCOSAL | Status: DC | PRN

## 2017-06-14 MED ORDER — GABAPENTIN 300 MG PO CAPS
ORAL_CAPSULE | ORAL | Status: AC
  Administered 2017-06-14: 300 mg via ORAL
  Filled 2017-06-14: qty 1

## 2017-06-14 MED ORDER — RIVAROXABAN 10 MG PO TABS: 10.0000 mg | ORAL_TABLET | Freq: Every day | ORAL | 0 refills | Status: AC

## 2017-06-14 MED ORDER — ACETAMINOPHEN 325 MG PO TABS: 650.0000 mg | ORAL_TABLET | ORAL | Status: DC | PRN

## 2017-06-14 MED ORDER — FENTANYL CITRATE (PF) 100 MCG/2ML IJ SOLN
25.0000 ug | INTRAMUSCULAR | Status: DC | PRN
  Administered 2017-06-14 (×3): 50 ug via INTRAVENOUS

## 2017-06-14 MED ORDER — LACTATED RINGERS IV SOLN: INTRAVENOUS | Status: DC

## 2017-06-14 MED ORDER — BUPIVACAINE-EPINEPHRINE 0.25% -1:200000 IJ SOLN
INTRAMUSCULAR | Status: DC | PRN
  Administered 2017-06-14: 30 mL

## 2017-06-14 MED ORDER — OXYCODONE HCL 5 MG PO TABS
ORAL_TABLET | ORAL | Status: AC
  Filled 2017-06-14: qty 1

## 2017-06-14 MED ORDER — KETOROLAC TROMETHAMINE 15 MG/ML IJ SOLN
15.0000 mg | Freq: Four times a day (QID) | INTRAMUSCULAR | Status: AC
  Administered 2017-06-14 – 2017-06-15 (×4): 15 mg via INTRAVENOUS
  Filled 2017-06-14 (×3): qty 1

## 2017-06-14 SURGICAL SUPPLY — 51 items
BAG DECANTER FOR FLEXI CONT (MISCELLANEOUS) ×3 IMPLANT
BLADE SAG 18X100X1.27 (BLADE) ×3 IMPLANT
CAPT HIP TOTAL 3 ×3 IMPLANT
CLOSURE STERI-STRIP 1/2X4 (GAUZE/BANDAGES/DRESSINGS) ×1
CLOSURE WOUND 1/2 X4 (GAUZE/BANDAGES/DRESSINGS) ×1
CLSR STERI-STRIP ANTIMIC 1/2X4 (GAUZE/BANDAGES/DRESSINGS) ×2 IMPLANT
COVER PERINEAL POST (MISCELLANEOUS) ×3 IMPLANT
COVER SURGICAL LIGHT HANDLE (MISCELLANEOUS) ×3 IMPLANT
DRAPE C-ARM 42X72 X-RAY (DRAPES) ×3 IMPLANT
DRAPE STERI IOBAN 125X83 (DRAPES) ×3 IMPLANT
DRAPE U-SHAPE 47X51 STRL (DRAPES) IMPLANT
DRSG MEPILEX BORDER 4X8 (GAUZE/BANDAGES/DRESSINGS) ×3 IMPLANT
DURAPREP 26ML APPLICATOR (WOUND CARE) ×3 IMPLANT
ELECT BLADE 4.0 EZ CLEAN MEGAD (MISCELLANEOUS) ×3
ELECT REM PT RETURN 9FT ADLT (ELECTROSURGICAL) ×3
ELECTRODE BLDE 4.0 EZ CLN MEGD (MISCELLANEOUS) ×1 IMPLANT
ELECTRODE REM PT RTRN 9FT ADLT (ELECTROSURGICAL) ×1 IMPLANT
FACESHIELD WRAPAROUND (MASK) ×9 IMPLANT
GLOVE BIO SURGEON STRL SZ7.5 (GLOVE) ×6 IMPLANT
GLOVE BIOGEL PI IND STRL 8 (GLOVE) ×2 IMPLANT
GLOVE BIOGEL PI INDICATOR 8 (GLOVE) ×4
GOWN STRL REUS W/ TWL LRG LVL3 (GOWN DISPOSABLE) ×2 IMPLANT
GOWN STRL REUS W/TWL LRG LVL3 (GOWN DISPOSABLE) ×6
KIT BASIN OR (CUSTOM PROCEDURE TRAY) ×3 IMPLANT
KIT ROOM TURNOVER OR (KITS) ×3 IMPLANT
MANIFOLD NEPTUNE II (INSTRUMENTS) ×3 IMPLANT
NDL SAFETY ECLIPSE 18X1.5 (NEEDLE) IMPLANT
NEEDLE HYPO 18GX1.5 SHARP (NEEDLE) ×3
NEEDLE HYPO 22GX1.5 SAFETY (NEEDLE) IMPLANT
NS IRRIG 1000ML POUR BTL (IV SOLUTION) ×3 IMPLANT
PACK TOTAL JOINT (CUSTOM PROCEDURE TRAY) ×3 IMPLANT
PAD ARMBOARD 7.5X6 YLW CONV (MISCELLANEOUS) ×3 IMPLANT
SHELL ACETABUL CLUSTER SZ 54 (Shell) ×3 IMPLANT
SPONGE LAP 18X18 X RAY DECT (DISPOSABLE) IMPLANT
STRIP CLOSURE SKIN 1/2X4 (GAUZE/BANDAGES/DRESSINGS) ×2 IMPLANT
SUT MNCRL AB 4-0 PS2 18 (SUTURE) ×3 IMPLANT
SUT MON AB 2-0 CT1 36 (SUTURE) ×3 IMPLANT
SUT VIC AB 0 CT1 27 (SUTURE) ×6
SUT VIC AB 0 CT1 27XBRD ANBCTR (SUTURE) ×2 IMPLANT
SUT VIC AB 1 CT1 27 (SUTURE) ×3
SUT VIC AB 1 CT1 27XBRD ANBCTR (SUTURE) ×1 IMPLANT
SUT VLOC 180 0 24IN GS25 (SUTURE) IMPLANT
SYR 50ML LL SCALE MARK (SYRINGE) ×3 IMPLANT
SYR BULB IRRIGATION 50ML (SYRINGE) ×3 IMPLANT
SYRINGE 20CC LL (MISCELLANEOUS) ×3 IMPLANT
TOWEL OR 17X24 6PK STRL BLUE (TOWEL DISPOSABLE) ×3 IMPLANT
TOWEL OR 17X26 10 PK STRL BLUE (TOWEL DISPOSABLE) ×3 IMPLANT
TRAY CATH 16FR W/PLASTIC CATH (SET/KITS/TRAYS/PACK) IMPLANT
TRAY FOLEY W/METER SILVER 16FR (SET/KITS/TRAYS/PACK) IMPLANT
WATER STERILE IRR 1000ML POUR (IV SOLUTION) IMPLANT
YANKAUER SUCT BULB TIP NO VENT (SUCTIONS) ×3 IMPLANT

## 2017-06-14 NOTE — Op Note (Signed)
06/14/2017  9:54 AM  PATIENT:  Joshua Baldwin   MRN: [id number]  PRE-OPERATIVE DIAGNOSIS:  OA RIGHT HIP  POST-OPERATIVE DIAGNOSIS:  OA RIGHT HIP  PROCEDURE:  Procedure(s): RIGHT TOTAL HIP ARTHROPLASTY ANTERIOR APPROACH  PREOPERATIVE INDICATIONS:    Dyon Rotert is an 56 y.o. male who has a diagnosis of <principal problem not specified> and elected for surgical management after failing conservative treatment.  The risks benefits and alternatives were discussed with the patient including but not limited to the risks of nonoperative treatment, versus surgical intervention including infection, bleeding, nerve injury, periprosthetic fracture, the need for revision surgery, dislocation, leg length discrepancy, blood clots, cardiopulmonary complications, morbidity, mortality, among others, and they were willing to proceed.     OPERATIVE REPORT     SURGEON:   Saundra Gin, Ernesta Amble, MD    ASSISTANT:  Roxan Hockey, PA-C, he was present and scrubbed throughout the case, critical for completion in a timely fashion, and for retraction, instrumentation, and closure.     ANESTHESIA:  General    COMPLICATIONS:  None.     COMPONENTS:  Stryker acolade fit femur size 5 with a 36 mm -0 head ball and a PSL acetabular shell size 56 with a  polyethylene liner    PROCEDURE IN DETAIL:   The patient was met in the holding area and  identified.  The appropriate hip was identified and marked at the operative site.  The patient was then transported to the OR  and  placed under anesthesia per that record.  At that point, the patient was  placed in the supine position and  secured to the operating room table and all bony prominences padded. He received pre-operative antibiotics    The operative lower extremity was prepped from the iliac crest to the distal leg.  Sterile draping was performed.  Time out was performed prior to incision.      Skin incision was made just 2 cm lateral to the ASIS  extending in  line with the tensor fascia lata. Electrocautery was used to control all bleeders. I dissected down sharply to the fascia of the tensor fascia lata was confirmed that the muscle fibers beneath were running posteriorly. I then incised the fascia over the superficial tensor fascia lata in line with the incision. The fascia was elevated off the anterior aspect of the muscle the muscle was retracted posteriorly and protected throughout the case. I then used electrocautery to incise the tensor fascia lata fascia control and all bleeders. Immediately visible was the fat over top of the anterior neck and capsule.  I removed the anterior fat from the capsule and elevated the rectus muscle off of the anterior capsule. I then removed a large time of capsule. The retractors were then placed over the anterior acetabulum as well as around the superior and inferior neck.  I then removed a section of the femoral neck and a napkin ring fashion. Then used the power course to remove the femoral head from the acetabulum and thoroughly irrigated the acetabulum. I sized the femoral head.    I then exposed the deep acetabulum, cleared out any tissue including the ligamentum teres.   After adequate visualization, I excised the labrum, and then sequentially reamed.  I then impacted the acetabular implant into place using fluoroscopy for guidance.  Appropriate version and inclination was confirmed clinically matching their bony anatomy, and with fluoroscopy.  I placed a 20 mm screw in the posterior/superio position with an excellent bite.  I then placed the polyethylene liner in place  I then adducted the leg and released the external rotators from the posterior femur allowing it to be easily delivered up lateral and anterior to the acetabulum for preparation of the femoral canal.    I then prepared the proximal femur using the cookie-cutter and then sequentially reamed and broached.  A trial broach, neck, and head was  utilized, and I reduced the hip and used floroscopy to assess the neck length and femoral implant.  I then impacted the femoral prosthesis into place into the appropriate version. The hip was then reduced and fluoroscopy confirmed appropriate position. Leg lengths were restored.  I then irrigated the hip copiously again with, and repaired the fascia with Vicryl, followed by monocryl for the subcutaneous tissue, Monocryl for the skin, Steri-Strips and sterile gauze. The patient was then awakened and returned to PACU in stable and satisfactory condition. There were no complications.  POST OPERATIVE PLAN: WBAT, DVT px: SCD's/TED, ambulation and chemical dvt px  Verdis Bassette, MD Orthopedic Surgeon [phone]     

## 2017-06-14 NOTE — Transfer of Care (Signed)
Immediate Anesthesia Transfer of Care Note  Patient: Joshua PockGregory Baldwin  Procedure(s) Performed: RIGHT TOTAL HIP ARTHROPLASTY ANTERIOR APPROACH (Right Hip)  Patient Location: PACU  Anesthesia Type:General  Level of Consciousness: awake, alert  and oriented  Airway & Oxygen Therapy: Patient Spontanous Breathing and Patient connected to nasal cannula oxygen  Post-op Assessment: Report given to RN and Post -op Vital signs reviewed and stable  Post vital signs: Reviewed and stable  Last Vitals:  Vitals:   06/14/17 0625 06/14/17 1027  BP: 113/75   Pulse: (!) 59   Resp: 20   Temp: 36.9 C (P) 36.6 C  SpO2: 98%     Last Pain:  Vitals:   06/14/17 0625  TempSrc: Oral  PainSc:          Complications: No apparent anesthesia complications

## 2017-06-14 NOTE — Evaluation (Signed)
Physical Therapy Evaluation Patient Details Name: Joshua PockGregory Quinonez MRN: [id number] DOB: Jul 06, 1961 Today's Date: 06/14/2017   History of Present Illness  Pt is a 56 y/o male s/p elective L THA. PMH includes DDD, back surgery, and former smoker.   Clinical Impression  Pt is s/p surgery above with deficits below. PTA, pt was independent with functional mobility. Upon eval, pt very eager for mobility and tolerated ambulation well. Presented with post op pain and guarding during gait. Required min guard for safety with use of RW. Reports wife will be able to assist at home with mobility and will need DME below. Follow up recommendations per MD arrangements. Will continue to follow acutely to maximize functional mobility independence and safety.     Follow Up Recommendations DC plan and follow up therapy as arranged by surgeon;Supervision for mobility/OOB    Equipment Recommendations  Rolling walker with 5" wheels;3in1 (PT)    Recommendations for Other Services       Precautions / Restrictions Precautions Precautions: None Precaution Comments: Reviewed THA handout with pt Restrictions Weight Bearing Restrictions: Yes RLE Weight Bearing: Weight bearing as tolerated      Mobility  Bed Mobility Overal bed mobility: Needs Assistance Bed Mobility: Supine to Sit     Supine to sit: Supervision     General bed mobility comments: Supervision for safety.   Transfers Overall transfer level: Needs assistance Equipment used: Rolling walker (2 wheeled) Transfers: Sit to/from Stand Sit to Stand: Min guard         General transfer comment: Min guard for safety.   Ambulation/Gait Ambulation/Gait assistance: Min guard Ambulation Distance (Feet): 150 Feet Assistive device: Rolling walker (2 wheeled) Gait Pattern/deviations: Step-to pattern;Step-through pattern;Decreased step length - right;Decreased step length - left;Decreased weight shift to right;Antalgic Gait velocity:  Decreased Gait velocity interpretation: Below normal speed for age/gender General Gait Details: Slow, slightly antalgic gait. Verbal cues for sequencing with RW and for upright posture throughout gait.   Stairs            Wheelchair Mobility    Modified Rankin (Stroke Patients Only)       Balance Overall balance assessment: Needs assistance Sitting-balance support: No upper extremity supported;Feet supported Sitting balance-Leahy Scale: Good     Standing balance support: Bilateral upper extremity supported;During functional activity Standing balance-Leahy Scale: Poor Standing balance comment: Reliant on UE support for balance.                              Pertinent Vitals/Pain Pain Assessment: Faces Faces Pain Scale: Hurts little more Pain Location: R hip  Pain Descriptors / Indicators: Aching;Operative site guarding Pain Intervention(s): Limited activity within patient's tolerance;Monitored during session;Repositioned    Home Living Family/patient expects to be discharged to:: Private residence Living Arrangements: Spouse/significant other Available Help at Discharge: Family;Available 24 hours/day Type of Home: House Home Access: Stairs to enter Entrance Stairs-Rails: Right;Left;Can reach both Entrance Stairs-Number of Steps: 4 Home Layout: One level Home Equipment: Cane - single point      Prior Function Level of Independence: Independent with assistive device(s)         Comments: Used cane for ambulation      Hand Dominance   Dominant Hand: Left    Extremity/Trunk Assessment   Upper Extremity Assessment Upper Extremity Assessment: Defer to OT evaluation    Lower Extremity Assessment Lower Extremity Assessment: RLE deficits/detail RLE Deficits / Details: Senosry in tact. Deficits consistent with post op  pain and weakness. Able to perform ther ex below.     Cervical / Trunk Assessment Cervical / Trunk Assessment: Normal   Communication   Communication: No difficulties  Cognition Arousal/Alertness: Awake/alert Behavior During Therapy: WFL for tasks assessed/performed Overall Cognitive Status: Within Functional Limits for tasks assessed                                        General Comments General comments (skin integrity, edema, etc.): Pt's wife present during session.     Exercises Total Joint Exercises Ankle Circles/Pumps: AROM;Both;20 reps Quad Sets: AROM;Right;10 reps Short Arc Quad: AROM;Right;10 reps Heel Slides: AROM;Right;10 reps Hip ABduction/ADduction: AROM;Right;10 reps   Assessment/Plan    PT Assessment Patient needs continued PT services  PT Problem List Decreased strength;Decreased balance;Decreased mobility;Decreased knowledge of use of DME;Pain       PT Treatment Interventions DME instruction;Gait training;Stair training;Functional mobility training;Therapeutic activities;Therapeutic exercise;Balance training;Neuromuscular re-education;Patient/family education    PT Goals (Current goals can be found in the Care Plan section)  Acute Rehab PT Goals Patient Stated Goal: to go home PT Goal Formulation: With patient Time For Goal Achievement: 06/21/17 Potential to Achieve Goals: Good    Frequency 7X/week   Barriers to discharge        Co-evaluation               AM-PAC PT "6 Clicks" Daily Activity  Outcome Measure Difficulty turning over in bed (including adjusting bedclothes, sheets and blankets)?: None Difficulty moving from lying on back to sitting on the side of the bed? : None Difficulty sitting down on and standing up from a chair with arms (e.g., wheelchair, bedside commode, etc,.)?: Unable Help needed moving to and from a bed to chair (including a wheelchair)?: A Little Help needed walking in hospital room?: A Little Help needed climbing 3-5 steps with a railing? : A Little 6 Click Score: 18    End of Session Equipment Utilized During  Treatment: Gait belt Activity Tolerance: Patient tolerated treatment well Patient left: in chair;with call bell/phone within reach;with family/visitor present Nurse Communication: Mobility status PT Visit Diagnosis: Other abnormalities of gait and mobility (R26.89);Pain Pain - Right/Left: Right Pain - part of body: Hip    Time: [id number] PT Time Calculation (min) (ACUTE ONLY): 33 min   Charges:   PT Evaluation $PT Eval Low Complexity: 1 Low PT Treatments $Gait Training: 8-22 mins   PT G Codes:        Gladys DammeBrittany Taziyah Iannuzzi, PT, DPT  Acute Rehabilitation Services  Pager: [phone]   Lehman PromBrittany S Donne Baley 06/14/2017, 5:54 PM

## 2017-06-14 NOTE — Anesthesia Procedure Notes (Signed)
Procedure Name: Intubation Date/Time: 06/14/2017 7:41 AM Performed by: Amreen Raczkowski T, CRNA Pre-anesthesia Checklist: Patient identified, Emergency Drugs available, Suction available and Patient being monitored Patient Re-evaluated:Patient Re-evaluated prior to induction Oxygen Delivery Method: Circle system utilized Preoxygenation: Pre-oxygenation with 100% oxygen Induction Type: IV induction Ventilation: Mask ventilation without difficulty Laryngoscope Size: Miller and 3 Grade View: Grade I Tube type: Oral Tube size: 7.5 mm Number of attempts: 1 Airway Equipment and Method: Patient positioned with wedge pillow and Stylet Placement Confirmation: ETT inserted through vocal cords under direct vision,  positive ETCO2 and breath sounds checked- equal and bilateral Secured at: 22 cm Tube secured with: Tape Dental Injury: Teeth and Oropharynx as per pre-operative assessment

## 2017-06-14 NOTE — Interval H&P Note (Signed)
History and Physical Interval Note:  06/14/2017 7:17 AM  Joshua Baldwin  has presented today for surgery, with the diagnosis of OA RIGHT HIP  The various methods of treatment have been discussed with the patient and family. After consideration of risks, benefits and other options for treatment, the patient has consented to  Procedure(s): RIGHT TOTAL HIP ARTHROPLASTY ANTERIOR APPROACH (Right) as a surgical intervention .  The patient's history has been reviewed, patient examined, no change in status, stable for surgery.  I have reviewed the patient's chart and labs.  Questions were answered to the patient's satisfaction.     Harlen Danford D

## 2017-06-14 NOTE — H&P (Signed)
Test

## 2017-06-14 NOTE — Anesthesia Postprocedure Evaluation (Signed)
Anesthesia Post Note  Patient: Joshua PockGregory Baldwin  Procedure(s) Performed: RIGHT TOTAL HIP ARTHROPLASTY ANTERIOR APPROACH (Right Hip)     Patient location during evaluation: PACU Anesthesia Type: General Level of consciousness: awake and alert Pain management: pain level controlled Vital Signs Assessment: post-procedure vital signs reviewed and stable Respiratory status: spontaneous breathing, nonlabored ventilation, respiratory function stable and patient connected to nasal cannula oxygen Cardiovascular status: blood pressure returned to baseline and stable Postop Assessment: no apparent nausea or vomiting Anesthetic complications: no    Last Vitals:  Vitals:   06/14/17 1315 06/14/17 1330  BP: 117/76 100/78  Pulse: 75 78  Resp: (!) 24 18  Temp:    SpO2: 100% 100%    Last Pain:  Vitals:   06/14/17 1330  TempSrc:   PainSc: 7                  Mohmed Farver EDWARD

## 2017-06-15 LAB — CBC
HEMATOCRIT: 22.5 % — AB (ref 39.0–52.0)
HEMOGLOBIN: 8 g/dL — AB (ref 13.0–17.0)
MCH: 30.8 pg (ref 26.0–34.0)
MCHC: 35.6 g/dL (ref 30.0–36.0)
MCV: 86.5 fL (ref 78.0–100.0)
Platelets: 216 10*3/uL (ref 150–400)
RBC: 2.6 MIL/uL — AB (ref 4.22–5.81)
RDW: 12.9 % (ref 11.5–15.5)
WBC: 17.3 10*3/uL — AB (ref 4.0–10.5)

## 2017-06-15 NOTE — Discharge Summary (Signed)
Discharge Summary  Patient ID: Joshua Baldwin MRN: [id number] DOB/AGE: [birth year] 56 y.o.  Admit date: 06/14/2017 Discharge date: 06/15/2017  Admission Diagnoses:  Primary osteoarthritis of right hip  Discharge Diagnoses:  Principal Problem:   Primary osteoarthritis of right hip Active Problems:   GERD (gastroesophageal reflux disease)   Chronic low back pain   Past Medical History:  Diagnosis Date  . DDD (degenerative disc disease), lumbar   . GERD (gastroesophageal reflux disease)   . Lung abnormality    ?cyst of lung recently seen on back film told by dr Gerlene Feekritzer  . Nicotine dependence   . Osteoarthritis    Hips  . Pneumonia    2017    Surgeries: Procedure(s): RIGHT TOTAL HIP ARTHROPLASTY ANTERIOR APPROACH on 06/14/2017   Consultants (if any):   Discharged Condition: Improved  Hospital Course: Joshua PockGregory Pavich is an 56 y.o. male who was admitted 06/14/2017 with a diagnosis of Primary osteoarthritis of right hip and went to the operating room on 06/14/2017 and underwent the above named procedures.    He was given perioperative antibiotics:  Anti-infectives (From admission, onward)   Start     Dose/Rate Route Frequency Ordered Stop   06/14/17 1400  ceFAZolin (ANCEF) IVPB 1 g/50 mL premix     1 g 100 mL/hr over 30 Minutes Intravenous Every 6 hours 06/14/17 1347 06/14/17 2123   06/14/17 1350  ceFAZolin (ANCEF) 1-4 GM/50ML-% IVPB    Comments:  Talamayan, Maria   : cabinet override      06/14/17 1350 06/15/17 0159   06/14/17 0613  ceFAZolin (ANCEF) 2-4 GM/100ML-% IVPB    Comments:  Rosenberger, Meredit: cabinet override      06/14/17 0613 06/14/17 0745   06/14/17 0605  ceFAZolin (ANCEF) IVPB 2g/100 mL premix     2 g 200 mL/hr over 30 Minutes Intravenous On call to O.R. 06/14/17 [id number] 06/14/17 0745    .  He was given sequential compression devices, early ambulation, and Xarelto for DVT prophylaxis.  He benefited maximally from the hospital stay and there were no  complications.   As hemoglobin postoperatively was down to 8.0<13.7.  Likely with significant delusional component.  Platelets within normal limits.  No sign of active bleeding.   He remained asymptomatic-no dizziness, weakness, lightheadedness with ambulation.     Recent vital signs:  Vitals:   06/14/17 2110 06/15/17 0730  BP: 113/65 113/64  Pulse: 61 74  Resp: 18 16  Temp: 98.4 F (36.9 C) 99.7 F (37.6 C)  SpO2: 100% 100%    Recent laboratory studies:  Lab Results  Component Value Date   HGB 8.0 (L) 06/15/2017   HGB 13.7 06/02/2017   HGB 15.0 02/11/2017   Lab Results  Component Value Date   WBC 17.3 (H) 06/15/2017   PLT 216 06/15/2017   No results found for: INR Lab Results  Component Value Date   NA 137 06/02/2017   K 3.9 06/02/2017   CL 102 06/02/2017   CO2 28 06/02/2017   BUN 7 06/02/2017   CREATININE 0.95 06/02/2017   GLUCOSE 99 06/02/2017    Discharge Medications:   Allergies as of 06/15/2017      Reactions   Gadolinium Derivatives Shortness Of Breath, Nausea And Vomiting   Mrv [mixed Respiratory Bacterial Vaccine] Other (See Comments)   Unable to recall reaction- unknown      Medication List    STOP taking these medications   ondansetron 4 MG disintegrating tablet Commonly known as:  ZOFRAN ODT  penicillin v potassium 500 MG tablet Commonly known as:  VEETID   traMADol 50 MG tablet Commonly known as:  ULTRAM     TAKE these medications   albuterol 108 (90 Base) MCG/ACT inhaler Commonly known as:  PROVENTIL HFA;VENTOLIN HFA Inhale 1 puff into the lungs every 6 (six) hours as needed for wheezing or shortness of breath.   dicyclomine 20 MG tablet Commonly known as:  BENTYL Take 1 tablet (20 mg total) by mouth 2 (two) times daily.   methocarbamol 500 MG tablet Commonly known as:  ROBAXIN Take 1 tablet (500 mg total) every 6 (six) hours as needed by mouth for muscle spasms.   omeprazole 20 MG capsule Commonly known as:  PRILOSEC Take 1  capsule (20 mg total) by mouth daily before breakfast. What changed:  when to take this   ondansetron 4 MG tablet Commonly known as:  ZOFRAN Take 1 tablet (4 mg total) every 8 (eight) hours as needed by mouth for nausea or vomiting.   oxyCODONE-acetaminophen 5-325 MG tablet Commonly known as:  ROXICET Take 1-2 tablets every 4 (four) hours as needed by mouth for severe pain.   rivaroxaban 10 MG Tabs tablet Commonly known as:  XARELTO Take 1 tablet (10 mg total) daily by mouth. For 30 days for DVT prophylaxis   sucralfate 1 GM/10ML suspension Commonly known as:  CARAFATE Take 1 g by mouth 2 (two) times daily.   tiZANidine 4 MG tablet Commonly known as:  ZANAFLEX Take 4 mg by mouth 2 (two) times daily.       Diagnostic Studies: Dg C-arm 61-120 Min  Result Date: 06/14/2017 CLINICAL DATA:  Right total hip replacement. EXAM: DG C-ARM 61-120 MIN; OPERATIVE RIGHT HIP WITH PELVIS COMPARISON:  None. FINDINGS: Two intraoperative spot fluoro images demonstrate patient to be status post right total hip replacement. No evidence for immediate hardware complications. IMPRESSION: Status post right total hip arthroplasty without complicating features. Electronically Signed   By: Kennith CenterEric  Mansell M.D.   On: 06/14/2017 11:28   Dg Hip Port Unilat With Pelvis 1v Right  Result Date: 06/14/2017 CLINICAL DATA:  Status post right total hip joint prosthesis placement. EXAM: DG HIP (WITH OR WITHOUT PELVIS) 1V PORT RIGHT COMPARISON:  Fluoro spot radiographs of today's date FINDINGS: The prosthetic left hip joint appears to be appropriately positioned. The interface with the native bone is normal. No acute native bone abnormality is observed. IMPRESSION: No acute postprocedure complication is observed following right total hip joint prosthesis placement. Electronically Signed   By: David  SwazilandJordan M.D.   On: 06/14/2017 13:17   Dg Hip Operative Unilat W Or W/o Pelvis Right  Result Date: 06/14/2017 CLINICAL DATA:   Right total hip replacement. EXAM: DG C-ARM 61-120 MIN; OPERATIVE RIGHT HIP WITH PELVIS COMPARISON:  None. FINDINGS: Two intraoperative spot fluoro images demonstrate patient to be status post right total hip replacement. No evidence for immediate hardware complications. IMPRESSION: Status post right total hip arthroplasty without complicating features. Electronically Signed   By: Kennith CenterEric  Mansell M.D.   On: 06/14/2017 11:28    Disposition: 01-Home or Self Care    Follow-up Information    Sheral ApleyMurphy, Timothy D, MD Follow up.   Specialty:  Orthopedic Surgery Contact information: [address] [place] [id number] [phone]            Signed: Albina BilletHenry Calvin Martensen III PA-C 06/15/2017, 7:52 AM

## 2017-06-15 NOTE — Progress Notes (Signed)
   Assessment / Plan: 1 Day Post-Op  S/P Procedure(s) (LRB): RIGHT TOTAL HIP ARTHROPLASTY ANTERIOR APPROACH (Right) by Dr. Jewel Baizeimothy D. Eulah PontMurphy on 06/14/2017  Principal Problem:   Primary osteoarthritis of right hip Active Problems:   GERD (gastroesophageal reflux disease)   Chronic low back pain  Acute Blood Loss Anemia, hemoglobin down to 8.0<13.7.  Asymptomatic-no dizziness, weakness, lightheadedness with ambulation.  Likely with significant delusional component.  Platelets within normal limits.  No sign of active bleeding.     Primary osteoarthritis right hip Progressing well postop day 1. Pain controlled.  Mobilizing well.  Eating, drinking, and voiding. Desires discharge to home today.  Up with therapy D/C IV fluids Discharge home with home health Incentive Spirometry Elevate and apply ice  Weight Bearing: Weight Bearing as Tolerated (WBAT)  Dressings: Maintain Mepilex.  VTE prophylaxis: Xarelto, SCDs, ambulation Dispo: Home today after therapy.  Subjective: Patient reports pain as mild.  Tolerating diet.  Urinating a lot.  +Flatus.  No CP, SOB.  OOB walking in hallway with therapy.  Objective:   VITALS:   Vitals:   06/14/17 1530 06/14/17 1541 06/14/17 2110 06/15/17 0730  BP: 115/83 111/75 113/65 113/64  Pulse: 60 62 61 74  Resp: 11 15 18 16   Temp: 97.8 F (36.6 C) 97.9 F (36.6 C) 98.4 F (36.9 C) 99.7 F (37.6 C)  TempSrc:  Oral Oral Oral  SpO2: 92% 100% 100% 100%   CBC Latest Ref Rng & Units 06/15/2017 06/02/2017 02/11/2017  WBC 4.0 - 10.5 K/uL 17.3(H) 7.8 13.4(H)  Hemoglobin 13.0 - 17.0 g/dL 8.0(L) 13.7 15.0  Hematocrit 39.0 - 52.0 % 22.5(L) 39.2 40.3  Platelets 150 - 400 K/uL 216 258 330   BMP Latest Ref Rng & Units 06/02/2017 02/11/2017 02/09/2017  Glucose 65 - 99 mg/dL 99 098(J107(H) 191(Y123(H)  BUN 6 - 20 mg/dL 7 9 9   Creatinine 0.61 - 1.24 mg/dL 7.820.95 9.560.91 2.131.09  Sodium 135 - 145 mmol/L 137 136 138  Potassium 3.5 - 5.1 mmol/L 3.9 3.4(L) 3.6  Chloride 101 - 111  mmol/L 102 98(L) 101  CO2 22 - 32 mmol/L 28 27 26   Calcium 8.9 - 10.3 mg/dL 9.1 9.4 9.9   Intake/Output      11/06 0701 - 11/07 0700 11/07 0701 - 11/08 0700   P.O. 480    I.V. 1507.7    IV Piggyback 550    Total Intake 2537.7    Urine 250    Blood 1350    Total Output 1600    Net +937.7            Physical Exam: General: NAD.  Calm, pleasant, conversant. Resp: No increased wob Cardio: regular rate and rhythm ABD soft Neurologically intact MSK Neurovascularly intact Sensation intact distally Intact pulses distally Dorsiflexion/Plantar flexion intact Incision: dressing C/D/I   Albina BilletHenry Calvin Martensen III, PA-C 06/15/2017, 7:41 AM

## 2017-06-15 NOTE — Evaluation (Signed)
Occupational Therapy Evaluation Patient Details Name: Joshua PockGregory Sabine MRN: [id number] DOB: 1960/11/02 Today's Date: 06/15/2017    History of Present Illness Pt is a 56 y/o male s/p elective L THA. PMH includes DDD, back surgery, and former smoker.    Clinical Impression   Pt s/p surgery listed above. PTA, pt was independent with ADLs and functional mobility.  Pt requiring min assist for LB ADL tasks but caregiver will be able to assist at home.  Educated pt on ADL techniques.  Pt will need 3n1 bedside commode for discharge home.  Education completed. No further acute OT services needed and will discharge.  Recommend discharge home with family.     Follow Up Recommendations  No OT follow up;Supervision/Assistance - 24 hour    Equipment Recommendations  3 in 1 bedside commode    Recommendations for Other Services       Precautions / Restrictions Restrictions Weight Bearing Restrictions: Yes RLE Weight Bearing: Weight bearing as tolerated      Mobility Bed Mobility Overal bed mobility: Modified Independent                Transfers Overall transfer level: Needs assistance Equipment used: Rolling walker (2 wheeled) Transfers: Sit to/from Stand Sit to Stand: Supervision         General transfer comment: VCs for hand placment to transition to sitting and standing.     Balance                                           ADL either performed or assessed with clinical judgement   ADL Overall ADL's : Needs assistance/impaired Eating/Feeding: Independent;Sitting   Grooming: Wash/dry hands;Wash/dry face;Oral care;Supervision/safety;Standing   Upper Body Bathing: Set up;Sitting   Lower Body Bathing: Sit to/from stand;Min guard   Upper Body Dressing : Set up;Sitting   Lower Body Dressing: Sit to/from stand;Minimal assistance   Toilet Transfer: Supervision/safety;RW           Functional mobility during ADLs: Supervision/safety;Rolling  walker General ADL Comments: Pt completed grooming tasks while standing at sink.  Pt requires min assist for LB dressing to don socks, but can reach down to ankles.  Reviewed LB Dressing technique (thread right LE through LB clothing first, opposite for undressing).  Pt reports he has been using this dressing technique prior to sx due to joint pain.      Vision Baseline Vision/History: No visual deficits Vision Assessment?: No apparent visual deficits     Perception     Praxis      Pertinent Vitals/Pain Pain Assessment: 0-10 Pain Score: 4  Pain Location: R hip  Pain Descriptors / Indicators: Operative site guarding Pain Intervention(s): Monitored during session;Ice applied     Hand Dominance     Extremity/Trunk Assessment             Communication     Cognition Arousal/Alertness: Awake/alert Behavior During Therapy: WFL for tasks assessed/performed Overall Cognitive Status: Within Functional Limits for tasks assessed                                     General Comments       Exercises     Shoulder Instructions      Home Living Family/patient expects to be discharged to:: Private residence Living Arrangements: Spouse/significant other  Available Help at Discharge: Family;Available 24 hours/day Type of Home: House Home Access: Stairs to enter Entergy CorporationEntrance Stairs-Number of Steps: 4 Entrance Stairs-Rails: Right;Left;Can reach both Home Layout: One level     Bathroom Shower/Tub: Producer, television/film/videoWalk-in shower   Bathroom Toilet: Standard     Home Equipment: Cane - single point;Grab bars - tub/shower;Shower seat          Prior Functioning/Environment Level of Independence: Independent with assistive device(s)                 OT Problem List: Decreased strength;Decreased activity tolerance;Impaired balance (sitting and/or standing);Pain      OT Treatment/Interventions:      OT Goals(Current goals can be found in the care plan section) Acute Rehab OT  Goals Patient Stated Goal: to go home  OT Frequency:     Barriers to D/C:            Co-evaluation              AM-PAC PT "6 Clicks" Daily Activity     Outcome Measure Help from another person eating meals?: None Help from another person taking care of personal grooming?: None Help from another person toileting, which includes using toliet, bedpan, or urinal?: A Little Help from another person bathing (including washing, rinsing, drying)?: A Little Help from another person to put on and taking off regular upper body clothing?: None Help from another person to put on and taking off regular lower body clothing?: A Little 6 Click Score: 21   End of Session Equipment Utilized During Treatment: Rolling walker  Activity Tolerance: Patient tolerated treatment well Patient left: in chair;with call bell/phone within reach(with PT)  OT Visit Diagnosis: Unsteadiness on feet (R26.81);Pain Pain - Right/Left: Right Pain - part of body: Hip                Time: 6213-08650840-0901 OT Time Calculation (min): 21 min Charges:  OT General Charges $OT Visit: 1 Visit OT Evaluation $OT Eval Low Complexity: 1 Low OT Treatments $Self Care/Home Management : 8-22 mins G-Codes:       Cipriano MileJohnson, Jenna Elizabeth OTR/L 06/15/2017, 9:17 AM

## 2017-06-15 NOTE — Progress Notes (Signed)
Joshua PockGregory Staib to be D/C'd Home per MD order.  Discussed prescriptions and follow up appointments with the patient. Prescriptions given to patient, medication list explained in detail. Pt verbalized understanding.  Allergies as of 06/15/2017      Reactions   Gadolinium Derivatives Shortness Of Breath, Nausea And Vomiting   Mrv [mixed Respiratory Bacterial Vaccine] Other (See Comments)   Unable to recall reaction- unknown      Medication List    STOP taking these medications   ondansetron 4 MG disintegrating tablet Commonly known as:  ZOFRAN ODT   penicillin v potassium 500 MG tablet Commonly known as:  VEETID   traMADol 50 MG tablet Commonly known as:  ULTRAM     TAKE these medications   albuterol 108 (90 Base) MCG/ACT inhaler Commonly known as:  PROVENTIL HFA;VENTOLIN HFA Inhale 1 puff into the lungs every 6 (six) hours as needed for wheezing or shortness of breath.   dicyclomine 20 MG tablet Commonly known as:  BENTYL Take 1 tablet (20 mg total) by mouth 2 (two) times daily.   methocarbamol 500 MG tablet Commonly known as:  ROBAXIN Take 1 tablet (500 mg total) every 6 (six) hours as needed by mouth for muscle spasms.   omeprazole 20 MG capsule Commonly known as:  PRILOSEC Take 1 capsule (20 mg total) by mouth daily before breakfast. What changed:  when to take this   ondansetron 4 MG tablet Commonly known as:  ZOFRAN Take 1 tablet (4 mg total) every 8 (eight) hours as needed by mouth for nausea or vomiting.   oxyCODONE-acetaminophen 5-325 MG tablet Commonly known as:  ROXICET Take 1-2 tablets every 4 (four) hours as needed by mouth for severe pain.   rivaroxaban 10 MG Tabs tablet Commonly known as:  XARELTO Take 1 tablet (10 mg total) daily by mouth. For 30 days for DVT prophylaxis   sucralfate 1 GM/10ML suspension Commonly known as:  CARAFATE Take 1 g by mouth 2 (two) times daily.   tiZANidine 4 MG tablet Commonly known as:  ZANAFLEX Take 4 mg by mouth 2  (two) times daily.            Durable Medical Equipment  (From admission, onward)        Start     Ordered   06/15/17 0944  For home use only DME 3 n 1  Once     06/15/17 0944   06/15/17 0944  For home use only DME Walker rolling  Once    Question:  Patient needs a walker to treat with the following condition  Answer:  Weakness   06/15/17 0944      Vitals:   06/14/17 2110 06/15/17 0730  BP: 113/65 113/64  Pulse: 61 74  Resp: 18 16  Temp: 98.4 F (36.9 C) 99.7 F (37.6 C)  SpO2: 100% 100%    Skin clean, dry and intact without evidence of skin break down, right hip aquacel in place, clean, dry, and intact. IV catheter discontinued intact. Site without signs and symptoms of complications. Dressing and pressure applied. Pt denies pain at this time. No complaints noted.  An After Visit Summary was printed and given to the patient. Patient escorted via WC, and D/C home via private auto.  GrenadaBrittany Jamella Grayer RN

## 2017-06-15 NOTE — Discharge Instructions (Signed)
INSTRUCTIONS AFTER JOINT REPLACEMENT  ° °o Remove items at home which could result in a fall. This includes throw rugs or furniture in walking pathways °o ICE to the affected joint every three hours while awake for 30 minutes at a time, for at least the first 3-5 days, and then as needed for pain and swelling.  Continue to use ice for pain and swelling. You may notice swelling that will progress down to the foot and ankle.  This is normal after surgery.  Elevate your leg when you are not up walking on it.   °o Continue to use the breathing machine you got in the hospital (incentive spirometer) which will help keep your temperature down.  It is common for your temperature to cycle up and down following surgery, especially at night when you are not up moving around and exerting yourself.  The breathing machine keeps your lungs expanded and your temperature down. ° ° °DIET:  As you were doing prior to hospitalization, we recommend a well-balanced diet. ° °DRESSING / WOUND CARE / SHOWERING ° °Keep the surgical dressing until follow up. IF THE DRESSING FALLS OFF or the wound gets wet inside, change the dressing with sterile gauze.  Please use good hand washing techniques before changing the dressing.  Do not use any lotions or creams on the incision until instructed by your surgeon.   ° °ACTIVITY ° °o Increase activity slowly as tolerated, but follow the weight bearing instructions below.   °o No driving for 6 weeks or until further direction given by your physician.  You cannot drive while taking narcotics.  °o No lifting or carrying greater than 10 lbs. until further directed by your surgeon. °o Avoid periods of inactivity such as sitting longer than an hour when not asleep. This helps prevent blood clots.  °o You may return to work once you are authorized by your doctor.  ° ° ° °WEIGHT BEARING  ° °Weight bearing as tolerated with assist device (walker, cane, etc) as directed, use it as long as suggested by your  surgeon or therapist, typically at least 4-6 weeks. ° ° °EXERCISES ° °Results after joint replacement surgery are often greatly improved when you follow the exercise, range of motion and muscle strengthening exercises prescribed by your doctor. Safety measures are also important to protect the joint from further injury. Any time any of these exercises cause you to have increased pain or swelling, decrease what you are doing until you are comfortable again and then slowly increase them. If you have problems or questions, call your caregiver or physical therapist for advice.  ° °Rehabilitation is important following a joint replacement. After just a few days of immobilization, the muscles of the leg can become weakened and shrink (atrophy).  These exercises are designed to build up the tone and strength of the thigh and leg muscles and to improve motion. Often times heat used for twenty to thirty minutes before working out will loosen up your tissues and help with improving the range of motion but do not use heat for the first two weeks following surgery (sometimes heat can increase post-operative swelling).  ° °These exercises can be done on a training (exercise) mat, on the floor, on a table or on a bed. Use whatever works the best and is most comfortable for you.    Use music or television while you are exercising so that the exercises are a pleasant break in your day. This will make your life better   with the exercises acting as a break in your routine that you can look forward to.   Perform all exercises about fifteen times, three times per day or as directed.  You should exercise both the operative leg and the other leg as well. ° °Exercises include: °  °• Quad Sets - Tighten up the muscle on the front of the thigh (Quad) and hold for 5-10 seconds.   °• Straight Leg Raises - With your knee straight (if you were given a brace, keep it on), lift the leg to 60 degrees, hold for 3 seconds, and slowly lower the leg.   Perform this exercise against resistance later as your leg gets stronger.  °• Leg Slides: Lying on your back, slowly slide your foot toward your buttocks, bending your knee up off the floor (only go as far as is comfortable). Then slowly slide your foot back down until your leg is flat on the floor again.  °• Angel Wings: Lying on your back spread your legs to the side as far apart as you can without causing discomfort.  °• Hamstring Strength:  Lying on your back, push your heel against the floor with your leg straight by tightening up the muscles of your buttocks.  Repeat, but this time bend your knee to a comfortable angle, and push your heel against the floor.  You may put a pillow under the heel to make it more comfortable if necessary.  ° °A rehabilitation program following joint replacement surgery can speed recovery and prevent re-injury in the future due to weakened muscles. Contact your doctor or a physical therapist for more information on knee rehabilitation.  ° ° °CONSTIPATION ° °Constipation is defined medically as fewer than three stools per week and severe constipation as less than one stool per week.  Even if you have a regular bowel pattern at home, your normal regimen is likely to be disrupted due to multiple reasons following surgery.  Combination of anesthesia, postoperative narcotics, change in appetite and fluid intake all can affect your bowels.  ° °YOU MUST use at least one of the following options; they are listed in order of increasing strength to get the job done.  They are all available over the counter, and you may need to use some, POSSIBLY even all of these options:   ° °Drink plenty of fluids (prune juice may be helpful) and high fiber foods °Colace 100 mg by mouth twice a day  °Senokot for constipation as directed and as needed Dulcolax (bisacodyl), take with full glass of water  °Miralax (polyethylene glycol) once or twice a day as needed. ° °If you have tried all these things and  are unable to have a bowel movement in the first 3-4 days after surgery call either your surgeon or your primary doctor.   ° °If you experience loose stools or diarrhea, hold the medications until you stool forms back up.  If your symptoms do not get better within 1 week or if they get worse, check with your doctor.  If you experience "the worst abdominal pain ever" or develop nausea or vomiting, please contact the office immediately for further recommendations for treatment. ° ° °ITCHING:  If you experience itching with your medications, try taking only a single pain pill, or even half a pain pill at a time.  You can also use Benadryl over the counter for itching or also to help with sleep.  ° °TED HOSE STOCKINGS:  Use stockings on both legs until   for at least 2 weeks or as directed by physician office. They may be removed at night for sleeping. ° °MEDICATIONS:  See your medication summary on the “After Visit Summary” that nursing will review with you.  You may have some home medications which will be placed on hold until you complete the course of blood thinner medication.  It is important for you to complete the blood thinner medication as prescribed. ° °PRECAUTIONS:  If you experience chest pain or shortness of breath - call 911 immediately for transfer to the hospital emergency department.  ° °If you develop a fever greater that 101 F, purulent drainage from wound, increased redness or drainage from wound, foul odor from the wound/dressing, or calf pain - CONTACT YOUR SURGEON.   °                                                °FOLLOW-UP APPOINTMENTS:  If you do not already have a post-op appointment, please call the office for an appointment to be seen by your surgeon.  Guidelines for how soon to be seen are listed in your “After Visit Summary”, but are typically between 1-4 weeks after surgery. ° °OTHER INSTRUCTIONS:  ° °Knee Replacement:  Do not place pillow under knee, focus on keeping the knee straight  while resting. CPM instructions: 0-90 degrees, 2 hours in the morning, 2 hours in the afternoon, and 2 hours in the evening. Place foam block, curve side up under heel at all times except when in CPM or when walking.  DO NOT modify, tear, cut, or change the foam block in any way. ° °MAKE SURE YOU:  °• Understand these instructions.  °• Get help right away if you are not doing well or get worse.  ° ° °Thank you for letting us be a part of your medical care team.  It is a privilege we respect greatly.  We hope these instructions will help you stay on track for a fast and full recovery!  ° ° ° ° °Information on my medicine - XARELTO® (Rivaroxaban) ° °This medication education was reviewed with me or my healthcare representative as part of my discharge preparation.  The pharmacist that spoke with me during my hospital stay was:  Daiya Tamer Dien, RPH ° °Why was Xarelto® prescribed for you? °Xarelto® was prescribed for you to reduce the risk of blood clots forming after orthopedic surgery. The medical term for these abnormal blood clots is venous thromboembolism (VTE). ° °What do you need to know about xarelto® ? °Take your Xarelto® ONCE DAILY at the same time every day. °You may take it either with or without food. ° °If you have difficulty swallowing the tablet whole, you may crush it and mix in applesauce just prior to taking your dose. ° °Take Xarelto® exactly as prescribed by your doctor and DO NOT stop taking Xarelto® without talking to the doctor who prescribed the medication.  Stopping without other VTE prevention medication to take the place of Xarelto® may increase your risk of developing a clot. ° °After discharge, you should have regular check-up appointments with your healthcare provider that is prescribing your Xarelto®.   ° °What do you do if you miss a dose? °If you miss a dose, take it as soon as you remember on the same day then continue your regularly scheduled once daily   regimen the next day. Do not  take two doses of Xarelto® on the same day.  ° °Important Safety Information °A possible side effect of Xarelto® is bleeding. You should call your healthcare provider right away if you experience any of the following: °? Bleeding from an injury or your nose that does not stop. °? Unusual colored urine (red or dark brown) or unusual colored stools (red or black). °? Unusual bruising for unknown reasons. °? A serious fall or if you hit your head (even if there is no bleeding). ° °Some medicines may interact with Xarelto® and might increase your risk of bleeding while on Xarelto®. To help avoid this, consult your healthcare provider or pharmacist prior to using any new prescription or non-prescription medications, including herbals, vitamins, non-steroidal anti-inflammatory drugs (NSAIDs) and supplements. ° °This website has more information on Xarelto®: www.xarelto.com. ° ° ° ° °

## 2017-06-15 NOTE — Progress Notes (Signed)
Physical Therapy Treatment Patient Details Name: Joshua PockGregory Espiritu MRN: [id number] DOB: 09-29-1960 Today's Date: 06/15/2017    History of Present Illness Pt is a 56 y/o male s/p elective L THA. PMH includes DDD, back surgery, and former smoker.     PT Comments    Continuing work on functional mobility and activity tolerance;  Improving gait posture and distance; stair training complete; OK for dc home from PT standpoint; If he is still here for afternoon session, will focus on therex and review steps   Follow Up Recommendations  DC plan and follow up therapy as arranged by surgeon;Supervision for mobility/OOB     Equipment Recommendations  Rolling walker with 5" wheels;3in1 (PT)(Short RW)    Recommendations for Other Services       Precautions / Restrictions Precautions Precautions: None Restrictions Weight Bearing Restrictions: Yes RLE Weight Bearing: Weight bearing as tolerated    Mobility  Bed Mobility Overal bed mobility: Modified Independent                Transfers Overall transfer level: Needs assistance Equipment used: Rolling walker (2 wheeled) Transfers: Sit to/from Stand Sit to Stand: Supervision         General transfer comment: VCs for hand placment to transition to sitting and standing.   Ambulation/Gait Ambulation/Gait assistance: Supervision Ambulation Distance (Feet): 100 Feet Assistive device: Rolling walker (2 wheeled) Gait Pattern/deviations: Step-to pattern;Step-through pattern;Decreased step length - right;Decreased step length - left;Decreased weight shift to right;Antalgic Gait velocity: Decreased   General Gait Details: Cues for upright posture and to maximize hip extension in R stance   Stairs Stairs: Yes   Stair Management: Two rails;Step to pattern;Forwards Number of Stairs: 5 General stair comments: Cues for sequence  Wheelchair Mobility    Modified Rankin (Stroke Patients Only)       Balance     Sitting  balance-Leahy Scale: Good       Standing balance-Leahy Scale: Fair                              Cognition Arousal/Alertness: Awake/alert Behavior During Therapy: WFL for tasks assessed/performed Overall Cognitive Status: Within Functional Limits for tasks assessed                                        Exercises      General Comments        Pertinent Vitals/Pain Pain Assessment: 0-10 Pain Score: 3  Pain Location: R hip  Pain Descriptors / Indicators: Operative site guarding Pain Intervention(s): Monitored during session    Home Living Family/patient expects to be discharged to:: Private residence Living Arrangements: Spouse/significant other Available Help at Discharge: Family;Available 24 hours/day Type of Home: House Home Access: Stairs to enter Entrance Stairs-Rails: Right;Left;Can reach both Home Layout: One level Home Equipment: Cane - single point;Grab bars - tub/shower;Shower seat      Prior Function Level of Independence: Independent with assistive device(s)          PT Goals (current goals can now be found in the care plan section) Acute Rehab PT Goals Patient Stated Goal: to go home PT Goal Formulation: With patient Time For Goal Achievement: 06/21/17 Potential to Achieve Goals: Good Progress towards PT goals: Progressing toward goals    Frequency    7X/week      PT Plan Current plan remains appropriate  Co-evaluation              AM-PAC PT "6 Clicks" Daily Activity  Outcome Measure  Difficulty turning over in bed (including adjusting bedclothes, sheets and blankets)?: None Difficulty moving from lying on back to sitting on the side of the bed? : None Difficulty sitting down on and standing up from a chair with arms (e.g., wheelchair, bedside commode, etc,.)?: A Little Help needed moving to and from a bed to chair (including a wheelchair)?: None Help needed walking in hospital room?: None Help needed  climbing 3-5 steps with a railing? : A Little 6 Click Score: 22    End of Session   Activity Tolerance: Patient tolerated treatment well Patient left: in chair;with call bell/phone within reach Nurse Communication: Mobility status PT Visit Diagnosis: Other abnormalities of gait and mobility (R26.89);Pain Pain - Right/Left: Right Pain - part of body: Hip     Time: [id number] PT Time Calculation (min) (ACUTE ONLY): 22 min  Charges:  $Gait Training: 8-22 mins                    G Codes:       Van ClinesHolly Korayma Hagwood, PT  Acute Rehabilitation Services Pager [phone] Office [phone]     Levi AlandHolly H Twylah Bennetts 06/15/2017, 10:08 AM

## 2017-06-15 NOTE — Care Management Note (Addendum)
Case Management Note  Patient Details  Name: Joshua Baldwin MRN: [id number] Date of Birth: 01/17/1961  Subjective/Objective:                 Spoke with patient at bedside. He states he does not have DME RW 3/1 as rec by PT, CM [facility] prior to DC. [facility]. [place] [facility] V[facility]. No other CM needs identified at this time. CM signing off.    Action/Plan:   Expected Discharge Date:  06/15/17               Expected Discharge Plan:  Home w Home Health Services  In-House Referral:     Discharge planning Services  CM Consult  Post Acute Care Choice:  Durable Medical Equipment, Home Health Choice offered to:  Patient  DME Arranged:  3-N-1, Walker rolling DME Agency:  [facility]  HH Arranged:  PT [facility]  Status of Service:  Completed, signed off  If discussed at [employer] Length of Stay Meetings, dates discussed:    Additional Comments:  Lawerance SabalDebbie Seham Gardenhire, RN 06/15/2017, 9:44 AM

## 2017-06-16 ENCOUNTER — Encounter (HOSPITAL_COMMUNITY): Payer: Self-pay | Admitting: Orthopedic Surgery

## 2018-01-15 IMAGING — CT CT ABD-PELV W/ CM
2 of 5 series · 16 of 46 positions shown, 18 images · IV contrast (iopamidol)
Comparison: 04/20/2016 noncontrast CT of the abdomen and pelvis

CLINICAL DATA: Left lower quadrant pain.  Nausea and vomiting.

EXAM:
CT ABDOMEN AND PELVIS WITH CONTRAST
TECHNIQUE: Multidetector CT imaging of the abdomen and pelvis was performed
using the standard protocol following bolus administration of
intravenous contrast.
CONTRAST:  100mL [id number] IOPAMIDOL (V47RJO-JRR) INJECTION 61%

[Series 2: rtn a/p with · axial · 0.68mm/px · z∈[-628,-248]mm · 13 of 90 slices shown, 15 images]
[im 7/90  soft-tissue]
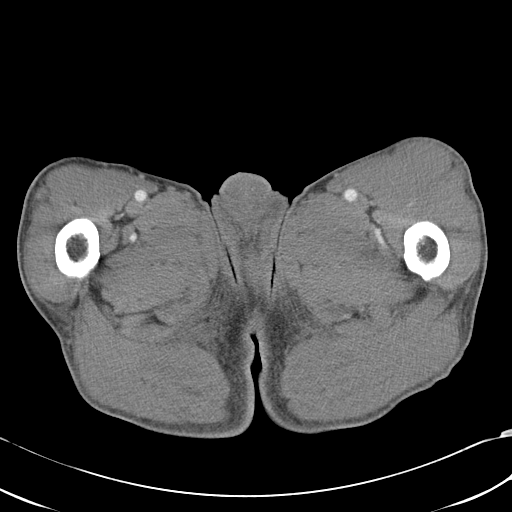
[im 7/90  bone]
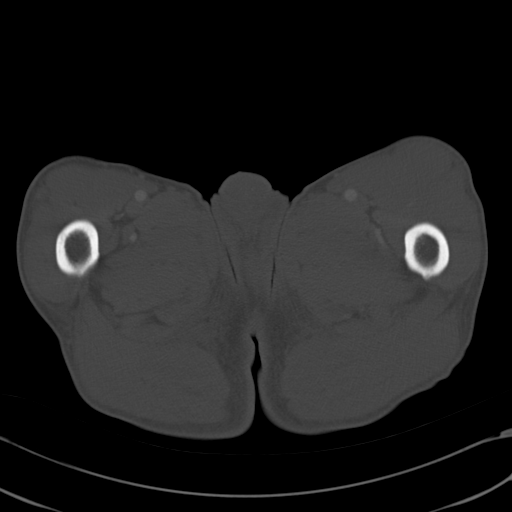
[im 13/90  soft-tissue]
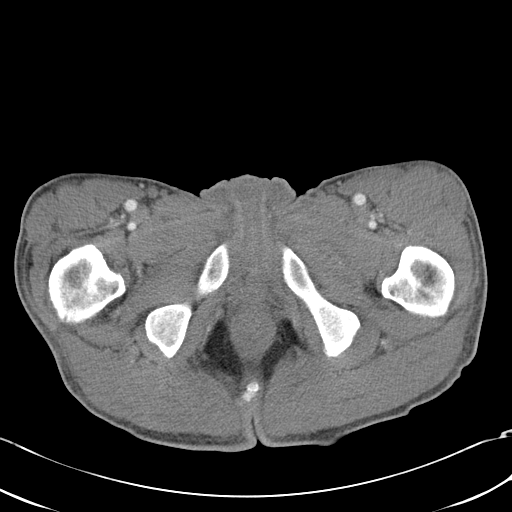
[im 20/90  soft-tissue]
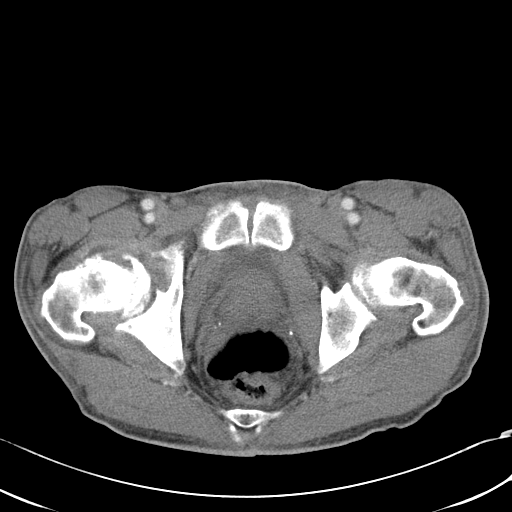
[im 26/90  soft-tissue]
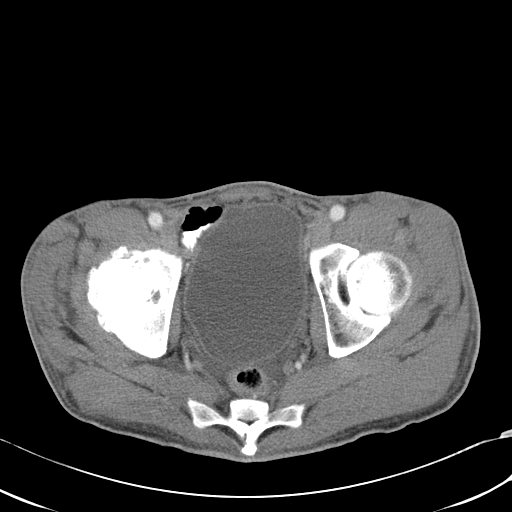
[im 32/90  soft-tissue]
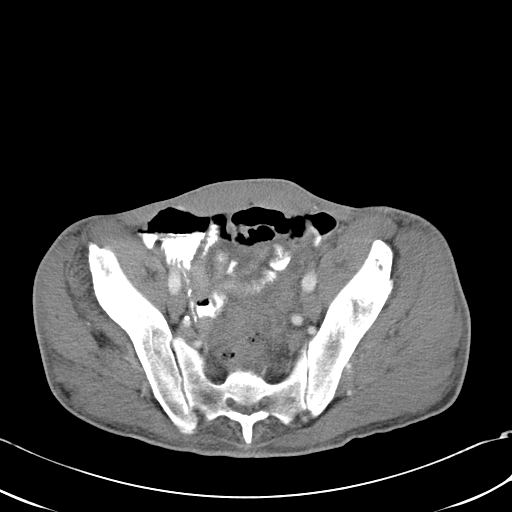
[im 39/90  soft-tissue]
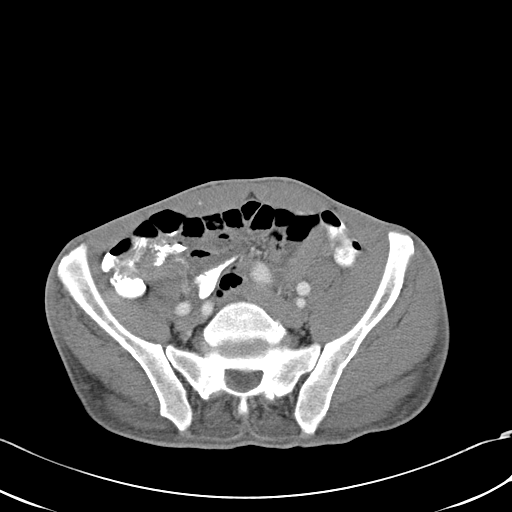
[im 45/90  soft-tissue]
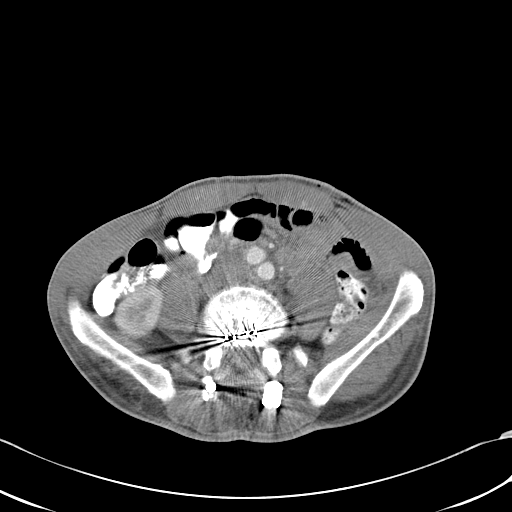
[im 51/90  soft-tissue]
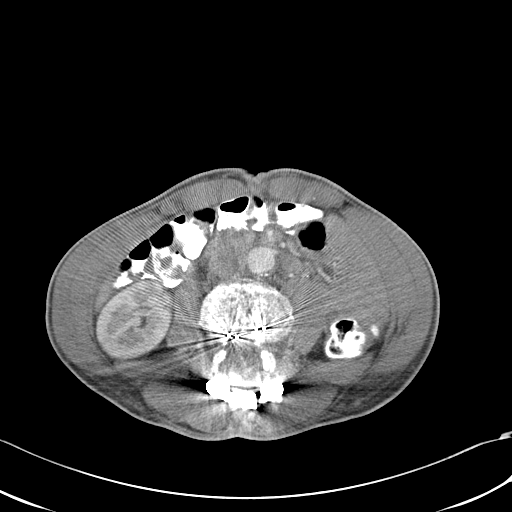
[im 58/90  soft-tissue]
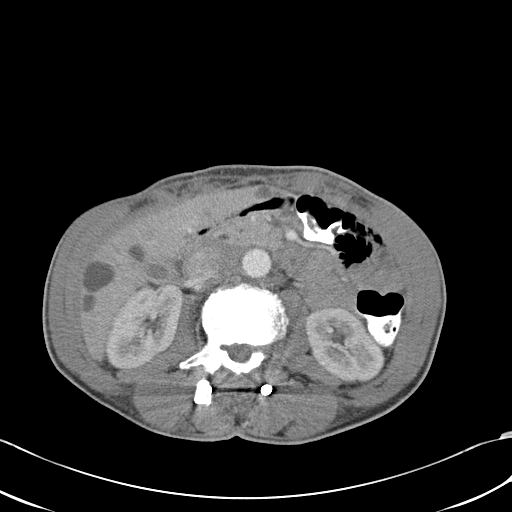
[im 58/90  bone]
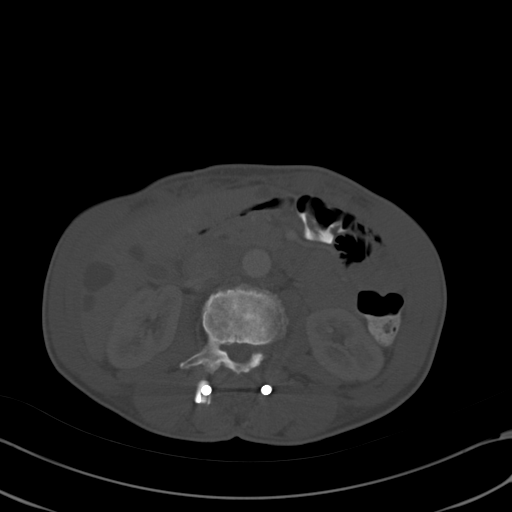
[im 64/90  soft-tissue]
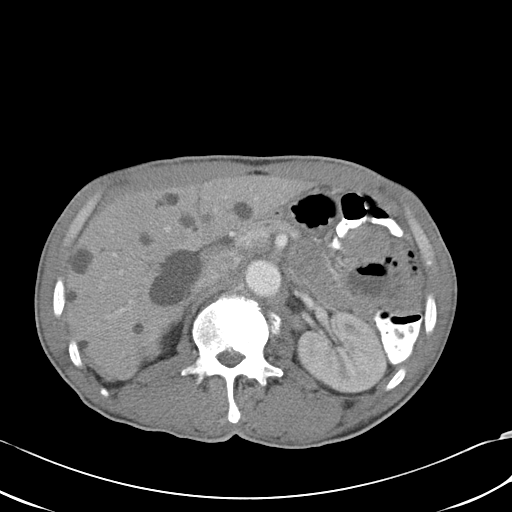
[im 70/90  soft-tissue]
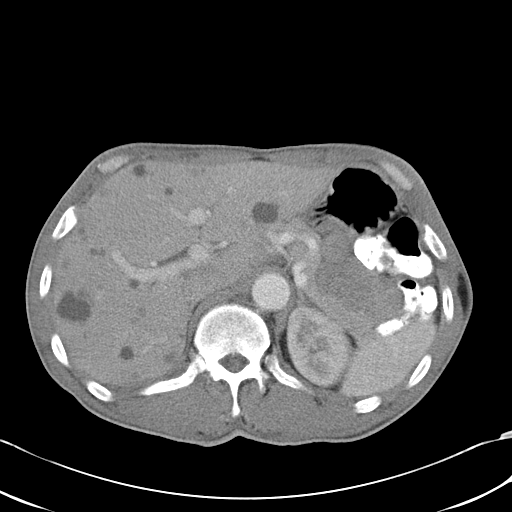
[im 77/90  soft-tissue]
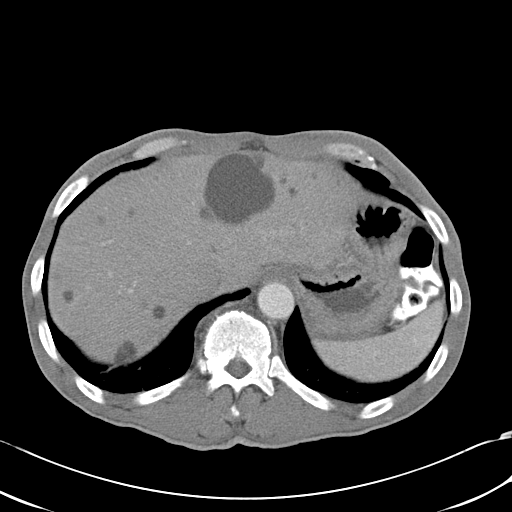
[im 83/90  soft-tissue]
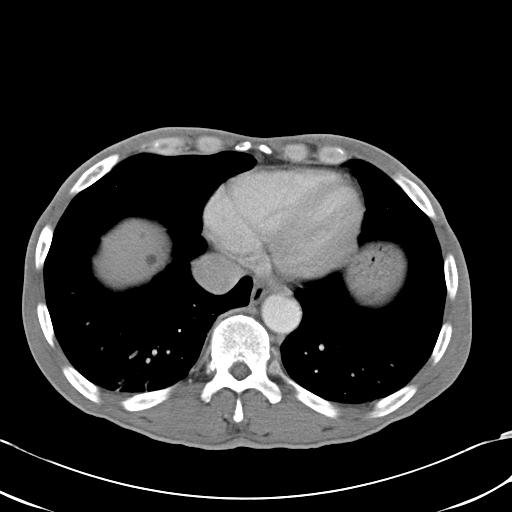

[Series 602: <mpr thick range> · coronal · 0.88mm/px · 3 of 113 slices shown]
[im 38/113  soft-tissue]
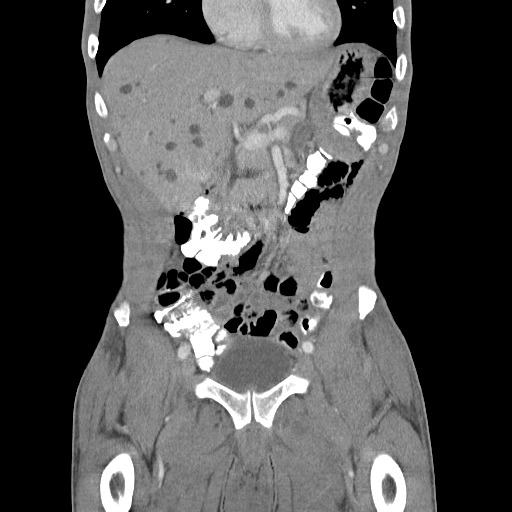
[im 50/113  soft-tissue]
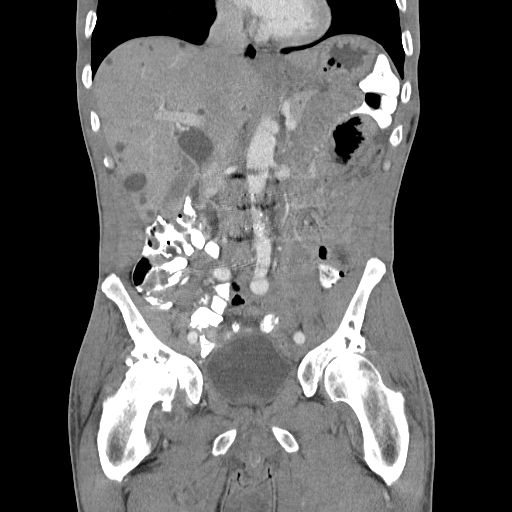
[im 63/113  soft-tissue]
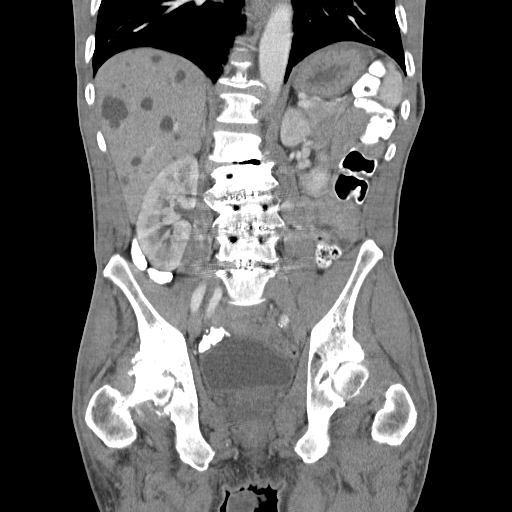

[16 of 46 positions shown; findings below may reference images not displayed]

FINDINGS: Lower chest: Dependent subsegmental atelectasis in both lower lobes.
No pleural effusion.

Hepatobiliary: Innumerable low-density lesions are again seen
throughout the liver, with the largest measuring 5.0 cm in the left
hepatic lobe and unchanged from the recent prior CT, most compatible
with cysts. Unremarkable gallbladder. No biliary dilatation.

Pancreas: Unremarkable.

Spleen: Unremarkable.

Adrenals/Urinary Tract: Unremarkable appearance of the adrenal
glands, kidneys, and bladder. No renal calculi or hydronephrosis
identified.

Stomach/Bowel: There is no evidence of bowel obstruction. Oral
contrast is present in loops of distal small bowel as well as in the
colon to the level of the descending colon. Assessment for bowel
wall thickening/inflammation is limited as the sigmoid colon and
majority of small bowel loops are decompressed, and there is a
paucity of peritoneal fat. The appendix is opacified with contrast
material and is unremarkable.

Vascular/Lymphatic: Minimal abdominal aortic atherosclerosis without
aneurysm. No enlarged lymph nodes identified.

Reproductive: Unremarkable prostate.

Other: New small volume free fluid in the pelvis. The no abdominal
wall hernia.

Musculoskeletal: Severe right and mild-to-moderate left hip joint
arthropathic changes again noted. Prior L2-L5 posterior
decompression and fusion. Severe disc degeneration on the left at
L1-2.
IMPRESSION: 1. New small volume pelvic free fluid, abnormal but of uncertain
etiology. Limited assessment for bowel inflammation due to
underdistention and paucity of intraperitoneal fat.
2. Unchanged numerous cystic liver lesions.
3. Minimal aortic atherosclerosis.

## 2019-01-11 ENCOUNTER — Emergency Department (HOSPITAL_COMMUNITY)
Admission: EM | Admit: 2019-01-11 | Discharge: 2019-01-11 | Disposition: A | Discharge status: Home / Self Care | Payer: Medicaid Other | Attending: Emergency Medicine | Admitting: Emergency Medicine

## 2019-01-11 ENCOUNTER — Other Ambulatory Visit: Payer: Self-pay

## 2019-01-11 ENCOUNTER — Encounter (HOSPITAL_COMMUNITY): Payer: Self-pay

## 2019-01-11 DIAGNOSIS — Z7901 Long term (current) use of anticoagulants: Secondary | ICD-10-CM | POA: Insufficient documentation

## 2019-01-11 DIAGNOSIS — Z79899 Other long term (current) drug therapy: Secondary | ICD-10-CM | POA: Diagnosis not present

## 2019-01-11 DIAGNOSIS — N179 Acute kidney failure, unspecified: Secondary | ICD-10-CM

## 2019-01-11 DIAGNOSIS — Z87891 Personal history of nicotine dependence: Secondary | ICD-10-CM | POA: Insufficient documentation

## 2019-01-11 DIAGNOSIS — R252 Cramp and spasm: Secondary | ICD-10-CM | POA: Diagnosis present

## 2019-01-11 DIAGNOSIS — Z5320 Procedure and treatment not carried out because of patient's decision for unspecified reasons: Secondary | ICD-10-CM | POA: Insufficient documentation

## 2019-01-11 DIAGNOSIS — M6282 Rhabdomyolysis: Secondary | ICD-10-CM | POA: Diagnosis not present

## 2019-01-11 DIAGNOSIS — X30XXXA Exposure to excessive natural heat, initial encounter: Secondary | ICD-10-CM | POA: Diagnosis not present

## 2019-01-11 LAB — URINALYSIS, ROUTINE W REFLEX MICROSCOPIC
Bilirubin Urine: NEGATIVE
Glucose, UA: NEGATIVE mg/dL
Ketones, ur: 5 mg/dL — AB
Leukocytes,Ua: NEGATIVE
Nitrite: NEGATIVE
Protein, ur: 100 mg/dL — AB
Specific Gravity, Urine: 1.016 (ref 1.005–1.030)
pH: 5 (ref 5.0–8.0)

## 2019-01-11 LAB — COMPREHENSIVE METABOLIC PANEL
ALT: 30 U/L (ref 0–44)
AST: 54 U/L — ABNORMAL HIGH (ref 15–41)
Albumin: 4.1 g/dL (ref 3.5–5.0)
Alkaline Phosphatase: 75 U/L (ref 38–126)
Anion gap: 11 (ref 5–15)
BUN: 16 mg/dL (ref 6–20)
CO2: 23 mmol/L (ref 22–32)
Calcium: 9.3 mg/dL (ref 8.9–10.3)
Chloride: 105 mmol/L (ref 98–111)
Creatinine, Ser: 1.64 mg/dL — ABNORMAL HIGH (ref 0.61–1.24)
GFR calc Af Amer: 53 mL/min — ABNORMAL LOW (ref >60)
GFR calc non Af Amer: 46 mL/min — ABNORMAL LOW (ref >60)
Glucose, Bld: 112 mg/dL — ABNORMAL HIGH (ref 70–99)
Potassium: 3.9 mmol/L (ref 3.5–5.1)
Sodium: 139 mmol/L (ref 135–145)
Total Bilirubin: 1.2 mg/dL (ref 0.3–1.2)
Total Protein: 8 g/dL (ref 6.5–8.1)

## 2019-01-11 LAB — CBC WITH DIFFERENTIAL/PLATELET
Abs Immature Granulocytes: 0.04 10*3/uL (ref 0.00–0.07)
Basophils Absolute: 0.1 10*3/uL (ref 0.0–0.1)
Basophils Relative: 0 %
Eosinophils Absolute: 0 10*3/uL (ref 0.0–0.5)
Eosinophils Relative: 0 %
HCT: 44.1 % (ref 39.0–52.0)
Hemoglobin: 15.5 g/dL (ref 13.0–17.0)
Immature Granulocytes: 0 %
Lymphocytes Relative: 14 %
Lymphs Abs: 2.1 10*3/uL (ref 0.7–4.0)
MCH: 31 pg (ref 26.0–34.0)
MCHC: 35.1 g/dL (ref 30.0–36.0)
MCV: 88.2 fL (ref 80.0–100.0)
Monocytes Absolute: 0.9 10*3/uL (ref 0.1–1.0)
Monocytes Relative: 6 %
Neutro Abs: 12.1 10*3/uL — ABNORMAL HIGH (ref 1.7–7.7)
Neutrophils Relative %: 80 %
Platelets: 265 10*3/uL (ref 150–400)
RBC: 5 MIL/uL (ref 4.22–5.81)
RDW: 13.5 % (ref 11.5–15.5)
WBC: 15.3 10*3/uL — ABNORMAL HIGH (ref 4.0–10.5)
nRBC: 0 % (ref 0.0–0.2)

## 2019-01-11 LAB — CK: Total CK: 3523 U/L — ABNORMAL HIGH (ref 49–397)

## 2019-01-11 LAB — LIPASE, BLOOD: Lipase: 24 U/L (ref 11–51)

## 2019-01-11 LAB — MAGNESIUM: Magnesium: 2 mg/dL (ref 1.7–2.4)

## 2019-01-11 MED ORDER — LACTATED RINGERS IV BOLUS: 1000.0000 mL | Freq: Once | INTRAVENOUS | Status: DC

## 2019-01-11 MED ORDER — LACTATED RINGERS IV BOLUS
1000.0000 mL | Freq: Once | INTRAVENOUS | Status: AC
  Administered 2019-01-11: 16:00:00 1000 mL via INTRAVENOUS

## 2019-01-11 MED ORDER — HYDROMORPHONE HCL 1 MG/ML IJ SOLN
1.0000 mg | Freq: Once | INTRAMUSCULAR | Status: AC
  Administered 2019-01-11: 1 mg via INTRAVENOUS
  Filled 2019-01-11: qty 1

## 2019-01-11 MED ORDER — SODIUM CHLORIDE 0.9 % IV SOLN: Freq: Once | INTRAVENOUS | Status: DC

## 2019-01-11 NOTE — ED Notes (Signed)
Pt standing in hallway. This RN asked pt what he needed. Pt stated that he needed his IV removed and he needed to leave. Pt informed of risks of leaving AMA, including death. Pt alert and oriented x4.

## 2019-01-11 NOTE — ED Triage Notes (Signed)
Pt BIB by EMS pt reports that he was sitting at the pool side for 3 hours, pt reports that he began having n/v, with associated generalized abdominal pain 9/10 cramping. Pt reports that he did try to drink a bottle of water but threw it up as well. Pt was given a  NS enroute. Pt did report to ems some relief.

## 2019-01-11 NOTE — ED Provider Notes (Signed)
[facility]-EMERGENCY DEPT Provider Note   CSN: [id number] Arrival date & time: 01/11/19  1502    History   Chief Complaint Chief Complaint  Patient presents with  . Heat Exposure  . Abdominal Pain    HPI Joshua Baldwin is a 58 y.o. male.     HPI  58 year old male presents with severe cramping.  He states he was at the pool for about 3 hours and it was hot outside.  He has had 1 bottle of water to drink today.  He states that he has had one episode of vomiting and has developed severe cramping.  The cramping seems to be worst in his abdomen but overall he is cramping in all of his extremities and torso.  Feels a little bit better after fluids given by EMS.  His urine has been dark but he states he drinks a lot of tea.  The cramping pain is severe.  Past Medical History:  Diagnosis Date  . DDD (degenerative disc disease), lumbar   . GERD (gastroesophageal reflux disease)   . Lung abnormality    ?cyst of lung recently seen on back film told by dr Gerlene Fee  . Nicotine dependence   . Osteoarthritis    Hips  . Pneumonia    2017    Patient Active Problem List   Diagnosis Date Noted  . GERD (gastroesophageal reflux disease) 10/25/2016  . Chronic low back pain 10/25/2016  . Primary osteoarthritis of right hip 10/25/2016  . Early diverticulitis of the colon 04/23/2016  . AKI (acute kidney injury) (HCC) 04/21/2016  . Leukocytosis 04/21/2016  . Nausea and vomiting 04/21/2016  . Hypokalemia 04/21/2016  . Liver cysts 04/21/2016  . Acute renal failure (HCC) 04/21/2016  . 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 BACK SURGERY  04  . TOTAL HIP ARTHROPLASTY Right 06/14/2017   Procedure: RIGHT TOTAL HIP ARTHROPLASTY ANTERIOR APPROACH;  Surgeon: Sheral Apley, MD;  Location: [facility] OR;  Service: Orthopedics;  Laterality: Right;        Home Medications    Prior to  Admission medications   Medication Sig Start Date End Date Taking? Authorizing Provider  albuterol (PROVENTIL HFA;VENTOLIN HFA) 108 (90 Base) MCG/ACT inhaler Inhale 1 puff into the lungs every 6 (six) hours as needed for wheezing or shortness of breath.    [provider]  dicyclomine (BENTYL) 20 MG tablet Take 1 tablet (20 mg total) by mouth 2 (two) times daily. 02/11/17   Trixie Dredge, PA-C  methocarbamol (ROBAXIN) 500 MG tablet Take 1 tablet (500 mg total) every 6 (six) hours as needed by mouth for muscle spasms. 06/14/17   Albina Billet III, PA-C  omeprazole (PRILOSEC) 20 MG capsule Take 1 capsule (20 mg total) by mouth daily before breakfast. Patient taking differently: Take 20 mg by mouth 2 (two) times daily before a meal.  10/05/16   Iva Boop, MD  ondansetron (ZOFRAN) 4 MG tablet Take 1 tablet (4 mg total) every 8 (eight) hours as needed by mouth for nausea or vomiting. 06/14/17   Albina Billet III, PA-C  oxyCODONE-acetaminophen (ROXICET) 5-325 MG tablet Take 1-2 tablets every 4 (four) hours as needed by mouth for severe pain. 06/14/17   Albina Billet III, PA-C  rivaroxaban (XARELTO) 10 MG TABS tablet Take 1 tablet (10 mg total) daily by mouth. For 30 days for DVT prophylaxis 06/14/17   Albina Billet  III, PA-C  sucralfate (CARAFATE) 1 GM/10ML suspension Take 1 g by mouth 2 (two) times daily.     [provider]  tiZANidine (ZANAFLEX) 4 MG tablet Take 4 mg by mouth 2 (two) times daily.  06/01/16   [provider]    Family History Family History  Problem Relation Age of Onset  . Other Father        died of work related accident    Social History Social History   Tobacco Use  . Smoking status: Former Smoker    Packs/day: 0.25    Years: 10.00    Pack years: 2.50    Types: Cigarettes    Last attempt to quit: 05/26/2017    Years since quitting: 1.6  . Smokeless tobacco: Never Used  Substance Use Topics  . Alcohol  use: No  . Drug use: No     Allergies   Gadolinium derivatives and Mrv [mixed respiratory bacterial vaccine]   Review of Systems Review of Systems  Constitutional: Negative for fever.  Respiratory: Negative for cough.   Gastrointestinal: Positive for abdominal pain and vomiting.  Musculoskeletal: Positive for myalgias.  All other systems reviewed and are negative.    Physical Exam Updated Vital Signs BP 137/86 (BP Location: Left Arm)   Pulse 77   Temp 98.1 F (36.7 C) (Oral)   Resp 20   Ht 5\' 7"  (1.702 m)   Wt 70.8 kg   SpO2 97%   BMI 24.43 kg/m   Physical Exam Vitals signs and nursing note reviewed.  Constitutional:      Appearance: He is well-developed. He is not ill-appearing or diaphoretic.  HENT:     Head: Normocephalic and atraumatic.     Right Ear: External ear normal.     Left Ear: External ear normal.     Nose: Nose normal.  Eyes:     General:        Right eye: No discharge.        Left eye: No discharge.  Neck:     Musculoskeletal: Neck supple.  Cardiovascular:     Rate and Rhythm: Normal rate and regular rhythm.     Heart sounds: Normal heart sounds.  Pulmonary:     Effort: Pulmonary effort is normal.     Breath sounds: Normal breath sounds.  Abdominal:     Palpations: Abdomen is soft.     Tenderness: There is generalized abdominal tenderness.  Musculoskeletal:     Comments: Patient has diffuse tenderness in large muscle groups in all 4 extremities  Skin:    General: Skin is warm and dry.  Neurological:     Mental Status: He is alert.  Psychiatric:        Mood and Affect: Mood is not anxious.      ED Treatments / Results  Labs (all labs ordered are listed, but only abnormal results are displayed) Labs Reviewed  COMPREHENSIVE METABOLIC PANEL - Abnormal; Notable for the following components:      Result Value   Glucose, Bld 112 (*)    Creatinine, Ser 1.64 (*)    AST 54 (*)    GFR calc non Af Amer 46 (*)    GFR calc Af Amer 53 (*)     All other components within normal limits  CBC WITH DIFFERENTIAL/PLATELET - Abnormal; Notable for the following components:   WBC 15.3 (*)    Neutro Abs 12.1 (*)    All other components within normal limits  CK -  Abnormal; Notable for the following components:   Total CK 3,523 (*)    All other components within normal limits  URINALYSIS, ROUTINE W REFLEX MICROSCOPIC - Abnormal; Notable for the following components:   APPearance HAZY (*)    Hgb urine dipstick SMALL (*)    Ketones, ur 5 (*)    Protein, ur 100 (*)    Bacteria, UA RARE (*)    All other components within normal limits  LIPASE, BLOOD  MAGNESIUM    EKG EKG Interpretation  Date/Time:  Thursday January 11 2019 16:28:56 EDT Ventricular Rate:  74 PR Interval:    QRS Duration: 100 QT Interval:  380 QTC Calculation: 422 R Axis:   70 Text Interpretation:  Sinus rhythm no acute ST/T changes Confirmed by Pricilla Loveless [phone]) on 01/11/2019 4:49:00 PM   Radiology No results found.  Procedures Procedures (including critical care time)  Medications Ordered in ED Medications  lactated ringers bolus 1,000 mL (has no administration in time range)  lactated ringers bolus 1,000 mL (1,000 mLs Intravenous New Bag/Given 01/11/19 1610)  HYDROmorphone (DILAUDID) injection 1 mg (1 mg Intravenous Given 01/11/19 1611)     Initial Impression / Assessment and Plan / ED Course  I have reviewed the triage vital signs and the nursing notes.  Pertinent labs & imaging results that were available during my care of the patient were reviewed by me and considered in my medical decision making (see chart for details).        6:36 PM patient's lab work is consistent with acute kidney injury with rhabdomyolysis.  Small amount of myoglobin in the urine.  I recommended he be admitted which originally he agreed to but now states he needs to go.  No one is at home or could take care of his dog.  He has received 1 IV fluid bolus from EMS and 1 here.   I strongly recommended he stay but he states he absolutely cannot.  He understands possible deleterious consequences of leaving early, including renal injury/failure leading to dialysis as well as death.  He does agree to 1 more IV fluid bolus and to increase fluids at home.  He was counseled he can return at any point and is encouraged to do so.  6:36 PM Patient apparently requested that his IV be removed and he walked out of the hospital after this.  I did not get a chance to talk to them again.  He has left AGAINST MEDICAL ADVICE and before the 3L IVF.  Final Clinical Impressions(s) / ED Diagnoses   Final diagnoses:  Non-traumatic rhabdomyolysis  Acute kidney injury [facility])    ED Discharge Orders    None       Pricilla Loveless, MD 01/11/19 1836

## 2020-03-13 MED FILL — oxyCODONE HCL 10 MG TABS: 10 | 30 days supply | Qty: 60 | Fill #0

## 2020-04-02 MED FILL — PREGABALIN 100 MG CAPS: 100 | 28 days supply | Qty: 170 | Fill #0

## 2020-04-02 MED FILL — METHOCARBAMOL 500 MG TABS: 500 | 25 days supply | Qty: 100 | Fill #0

## 2020-04-02 MED FILL — AMITRIPTYLINE HCL 10 MG TAB: 10 | 25 days supply | Qty: 50 | Fill #0

## 2020-04-11 MED FILL — oxyCODONE HCL 10 MG TABS: 10 | 20 days supply | Qty: 40 | Fill #0

## 2020-04-30 MED FILL — DICLOFENAC SODIUM 1 % GEL: 1 | 31 days supply | Qty: 500 | Fill #0

## 2020-04-30 MED FILL — AMITRIPTYLINE HCL 10 MG TAB: 10 | 30 days supply | Qty: 60 | Fill #0

## 2020-04-30 MED FILL — METHOCARBAMOL 500 MG TABS: 500 | 25 days supply | Qty: 100 | Fill #0

## 2020-04-30 MED FILL — oxyCODONE HCL 10 MG TABS: 10 | 30 days supply | Qty: 90 | Fill #0

## 2020-05-28 ENCOUNTER — Other Ambulatory Visit (HOSPITAL_COMMUNITY): Payer: Self-pay | Admitting: Pain Medicine

## 2020-05-28 MED FILL — PREGABALIN 100 MG CAPS: 100 | 30 days supply | Qty: 170 | Fill #0

## 2020-05-28 MED FILL — oxyCODONE HCL 10 MG TABS: 10 | 28 days supply | Qty: 110 | Fill #0

## 2020-05-28 MED FILL — METHOCARBAMOL 500 MG TABS: 500 | 25 days supply | Qty: 100 | Fill #0

## 2020-06-18 ENCOUNTER — Other Ambulatory Visit (HOSPITAL_COMMUNITY): Payer: Self-pay | Admitting: Infectious Diseases

## 2020-06-18 MED FILL — FLUCONAZOLE 150 MG TABS: 150 | 14 days supply | Qty: 2 | Fill #0

## 2020-06-24 ENCOUNTER — Other Ambulatory Visit (HOSPITAL_COMMUNITY): Payer: Self-pay | Admitting: Pain Medicine

## 2020-06-24 MED FILL — PREGABALIN 100 MG CAPS: 100 | 28 days supply | Qty: 170 | Fill #0

## 2020-06-24 MED FILL — AMITRIPTYLINE HCL 10 MG TAB: 10 | 30 days supply | Qty: 30 | Fill #0

## 2020-06-24 MED FILL — METHOCARBAMOL 500 MG TABS: 500 | 25 days supply | Qty: 100 | Fill #0

## 2020-06-24 MED FILL — oxyCODONE HCL 10 MG TABS: 10 | 27 days supply | Qty: 110 | Fill #0

## 2020-07-22 ENCOUNTER — Other Ambulatory Visit (HOSPITAL_COMMUNITY): Payer: Self-pay | Admitting: Pain Medicine

## 2020-07-22 MED FILL — DICLOFENAC SODIUM 1 % GEL: 1 | 30 days supply | Qty: 500 | Fill #0

## 2020-07-22 MED FILL — METHOCARBAMOL 500 MG TABS: 500 | 25 days supply | Qty: 100 | Fill #0

## 2020-07-22 MED FILL — PREGABALIN 100 MG CAPS: 100 | 28 days supply | Qty: 170 | Fill #0

## 2020-07-22 MED FILL — AMITRIPTYLINE HCL 10 MG TAB: 10 | 25 days supply | Qty: 50 | Fill #0

## 2020-07-23 MED FILL — oxyCODONE HCL 10 MG TABS: 10 | 30 days supply | Qty: 110 | Fill #0

## 2020-08-19 ENCOUNTER — Other Ambulatory Visit (HOSPITAL_COMMUNITY): Payer: Self-pay | Admitting: Pain Medicine

## 2020-08-19 MED FILL — AMITRIPTYLINE HCL 10 MG TAB: 10 | 25 days supply | Qty: 50 | Fill #0

## 2020-08-19 MED FILL — METHOCARBAMOL 500 MG TABLET: 500 | 25 days supply | Qty: 100 | Fill #0

## 2020-08-19 MED FILL — DICLOFENAC SODIUM 1 % GEL: 1 | 30 days supply | Qty: 500 | Fill #0

## 2020-08-20 MED FILL — oxyCODONE HCL 10 MG TABS: 10 | 30 days supply | Qty: 110 | Fill #0

## 2020-09-16 ENCOUNTER — Other Ambulatory Visit (HOSPITAL_COMMUNITY): Payer: Self-pay | Admitting: Pain Medicine

## 2020-09-16 MED FILL — AMITRIPTYLINE HCL 10 MG TAB: 10 | 25 days supply | Qty: 50 | Fill #0

## 2020-09-16 MED FILL — METHOCARBAMOL 500 MG TABS: 500 | 25 days supply | Qty: 100 | Fill #0

## 2020-09-16 MED FILL — PREGABALIN 100 MG CAPS: 100 | 28 days supply | Qty: 170 | Fill #0

## 2020-09-16 MED FILL — oxyCODONE HCL 10 MG TABS: 10 | 30 days supply | Qty: 110 | Fill #0

## 2020-10-14 ENCOUNTER — Other Ambulatory Visit (HOSPITAL_COMMUNITY): Payer: Self-pay | Admitting: Pain Medicine

## 2020-10-14 MED FILL — METHOCARBAMOL 500 MG TABS: 500 | 25 days supply | Qty: 100 | Fill #0

## 2020-10-14 MED FILL — DICLOFENAC SODIUM 1 % GEL: 1 | 30 days supply | Qty: 500 | Fill #0

## 2020-10-14 MED FILL — oxyCODONE HCL 10 MG TABS: 10 | 27 days supply | Qty: 110 | Fill #0

## 2020-10-14 MED FILL — PREGABALIN 100 MG CAPS: 100 | 30 days supply | Qty: 180 | Fill #0

## 2020-10-14 MED FILL — AMITRIPTYLINE HCL 10 MG TAB: 10 | 25 days supply | Qty: 50 | Fill #0

## 2020-11-11 ENCOUNTER — Other Ambulatory Visit (HOSPITAL_COMMUNITY): Payer: Self-pay

## 2020-11-11 MED ORDER — OXYCODONE HCL 10 MG PO TABS
ORAL_TABLET | ORAL | 0 refills | Status: DC
  Filled 2020-11-11: qty 110, 30d supply, fill #0

## 2020-11-11 MED ORDER — AMITRIPTYLINE HCL 10 MG PO TABS
ORAL_TABLET | ORAL | 1 refills | Status: AC
  Filled 2020-11-11: qty 50, 30d supply, fill #0

## 2020-11-11 MED ORDER — DICLOFENAC SODIUM 1 % EX GEL
CUTANEOUS | 2 refills | Status: AC
Start: 2020-11-11 — End: ?
  Filled 2020-11-11: qty 500, 31d supply, fill #0

## 2020-11-11 MED ORDER — METHOCARBAMOL 500 MG PO TABS
ORAL_TABLET | ORAL | 2 refills | Status: AC
  Filled 2020-11-11: qty 100, 25d supply, fill #0

## 2020-11-11 MED ORDER — PREGABALIN 100 MG PO CAPS
ORAL_CAPSULE | ORAL | 0 refills | Status: AC
  Filled 2020-11-11: qty 180, 30d supply, fill #0

## 2020-11-12 ENCOUNTER — Other Ambulatory Visit (HOSPITAL_COMMUNITY): Payer: Self-pay

## 2020-11-20 ENCOUNTER — Other Ambulatory Visit (HOSPITAL_COMMUNITY): Payer: Self-pay

## 2020-12-09 ENCOUNTER — Other Ambulatory Visit (HOSPITAL_COMMUNITY): Payer: Self-pay

## 2020-12-09 MED ORDER — OXYCODONE HCL 10 MG PO TABS
ORAL_TABLET | ORAL | 0 refills | Status: DC
  Filled 2020-12-09: qty 110, 28d supply, fill #0

## 2020-12-09 MED ORDER — PREGABALIN 100 MG PO CAPS
ORAL_CAPSULE | ORAL | 0 refills | Status: AC
  Filled 2020-12-09: qty 180, 30d supply, fill #0

## 2020-12-09 MED ORDER — METHOCARBAMOL 500 MG PO TABS
ORAL_TABLET | ORAL | 2 refills | Status: AC
  Filled 2020-12-09: qty 100, 25d supply, fill #0
  Filled 2021-03-03: qty 56, 14d supply, fill #1

## 2020-12-09 MED ORDER — DICLOFENAC SODIUM 1 % EX GEL
CUTANEOUS | 2 refills | Status: AC
  Filled 2020-12-09: qty 500, 31d supply, fill #0

## 2020-12-09 MED ORDER — AMITRIPTYLINE HCL 10 MG PO TABS
ORAL_TABLET | ORAL | 1 refills | Status: AC
  Filled 2020-12-09: qty 50, 25d supply, fill #0

## 2021-01-07 ENCOUNTER — Other Ambulatory Visit (HOSPITAL_COMMUNITY): Payer: Self-pay

## 2021-01-07 MED ORDER — DICLOFENAC SODIUM 1 % EX GEL
CUTANEOUS | 2 refills | Status: AC
  Filled 2021-01-07: qty 400, 25d supply, fill #0

## 2021-01-07 MED ORDER — PREGABALIN 100 MG PO CAPS
ORAL_CAPSULE | ORAL | 0 refills | Status: AC
  Filled 2021-01-07: qty 180, 30d supply, fill #0

## 2021-01-07 MED ORDER — AMITRIPTYLINE HCL 10 MG PO TABS
ORAL_TABLET | ORAL | 1 refills | Status: AC
  Filled 2021-01-07: qty 50, 25d supply, fill #0

## 2021-01-07 MED ORDER — OXYCODONE HCL 10 MG PO TABS
ORAL_TABLET | ORAL | 0 refills | Status: DC
  Filled 2021-01-07: qty 110, 30d supply, fill #0

## 2021-01-08 ENCOUNTER — Other Ambulatory Visit (HOSPITAL_COMMUNITY): Payer: Self-pay

## 2021-01-12 ENCOUNTER — Other Ambulatory Visit (HOSPITAL_COMMUNITY): Payer: Self-pay

## 2021-01-12 MED ORDER — AZITHROMYCIN 250 MG PO TABS
ORAL_TABLET | ORAL | 0 refills | Status: AC
  Filled 2021-01-12: qty 6, 5d supply, fill #0

## 2021-01-19 ENCOUNTER — Other Ambulatory Visit (HOSPITAL_COMMUNITY): Payer: Self-pay

## 2021-01-19 MED ORDER — AMOXICILLIN 500 MG PO CAPS
500.0000 mg | ORAL_CAPSULE | Freq: Three times a day (TID) | ORAL | 0 refills | Status: AC
  Filled 2021-01-19: qty 21, 7d supply, fill #0

## 2021-01-19 MED ORDER — PREDNISONE 10 MG (21) PO TBPK
ORAL_TABLET | ORAL | 0 refills | Status: AC
  Filled 2021-01-19: qty 21, 6d supply, fill #0

## 2021-02-03 ENCOUNTER — Other Ambulatory Visit (HOSPITAL_COMMUNITY): Payer: Self-pay

## 2021-02-03 MED ORDER — METHOCARBAMOL 500 MG PO TABS
ORAL_TABLET | ORAL | 2 refills | Status: AC
  Filled 2021-02-03 – 2021-02-12 (×2): qty 100, 25d supply, fill #0

## 2021-02-03 MED ORDER — PREGABALIN 100 MG PO CAPS
ORAL_CAPSULE | ORAL | 0 refills | Status: AC
  Filled 2021-02-03 – 2021-02-12 (×2): qty 90, 30d supply, fill #0

## 2021-02-03 MED ORDER — OXYCODONE HCL 10 MG PO TABS
ORAL_TABLET | ORAL | 0 refills | Status: AC
  Filled 2021-02-03: qty 80, 17d supply, fill #0
  Filled 2021-02-05: qty 15, 5d supply, fill #0
  Filled 2021-02-05: qty 80, 26d supply, fill #0
  Filled 2021-02-11: qty 65, 21d supply, fill #1
  Filled 2021-02-11 (×2): qty 15, 5d supply, fill #1

## 2021-02-03 MED ORDER — DICLOFENAC SODIUM 1 % EX GEL
CUTANEOUS | 2 refills | Status: AC
  Filled 2021-02-03: qty 500, 31d supply, fill #0
  Filled 2021-02-12: qty 500, 30d supply, fill #0

## 2021-02-03 MED ORDER — AMITRIPTYLINE HCL 10 MG PO TABS
ORAL_TABLET | ORAL | 1 refills | Status: AC
  Filled 2021-02-03: qty 50, 30d supply, fill #0

## 2021-02-05 ENCOUNTER — Other Ambulatory Visit (HOSPITAL_COMMUNITY): Payer: Self-pay

## 2021-02-10 ENCOUNTER — Other Ambulatory Visit (HOSPITAL_COMMUNITY): Payer: Self-pay

## 2021-02-11 ENCOUNTER — Other Ambulatory Visit (HOSPITAL_COMMUNITY): Payer: Self-pay

## 2021-02-12 ENCOUNTER — Other Ambulatory Visit (HOSPITAL_COMMUNITY): Payer: Self-pay

## 2021-02-12 MED ORDER — OXYCODONE HCL 10 MG PO TABS
ORAL_TABLET | ORAL | 0 refills | Status: AC
  Filled 2021-02-12: qty 65, 21d supply, fill #0

## 2021-03-03 ENCOUNTER — Other Ambulatory Visit (HOSPITAL_COMMUNITY): Payer: Self-pay

## 2021-03-03 MED ORDER — AMITRIPTYLINE HCL 10 MG PO TABS
ORAL_TABLET | ORAL | 1 refills | Status: AC
  Filled 2021-03-03: qty 50, 30d supply, fill #0

## 2021-03-03 MED ORDER — PREGABALIN 100 MG PO CAPS
ORAL_CAPSULE | ORAL | 0 refills | Status: AC
  Filled 2021-03-03 (×2): qty 90, 30d supply, fill #0

## 2021-03-03 MED ORDER — DICLOFENAC SODIUM 1 % EX GEL
CUTANEOUS | 2 refills | Status: AC
  Filled 2021-03-03: qty 500, 32d supply, fill #0
  Filled 2021-03-03: qty 500, 31d supply, fill #0

## 2021-03-03 MED ORDER — OXYCODONE HCL 10 MG PO TABS
ORAL_TABLET | ORAL | 0 refills | Status: AC
  Filled 2021-03-03: qty 80, 30d supply, fill #0

## 2021-03-07 ENCOUNTER — Other Ambulatory Visit (HOSPITAL_COMMUNITY): Payer: Self-pay

## 2021-03-11 ENCOUNTER — Other Ambulatory Visit (HOSPITAL_COMMUNITY): Payer: Self-pay

## 2021-03-16 ENCOUNTER — Other Ambulatory Visit (HOSPITAL_COMMUNITY): Payer: Self-pay

## 2021-03-18 ENCOUNTER — Emergency Department (HOSPITAL_COMMUNITY)
Admission: EM | Admit: 2021-03-18 | Discharge: 2021-03-19 | Disposition: A | Discharge status: Home / Self Care | Payer: Medicaid Other | Attending: Emergency Medicine | Admitting: Emergency Medicine

## 2021-03-18 ENCOUNTER — Encounter (HOSPITAL_COMMUNITY): Payer: Self-pay | Admitting: Emergency Medicine

## 2021-03-18 ENCOUNTER — Emergency Department (HOSPITAL_COMMUNITY): Payer: Medicaid Other

## 2021-03-18 DIAGNOSIS — R110 Nausea: Secondary | ICD-10-CM | POA: Diagnosis not present

## 2021-03-18 DIAGNOSIS — R252 Cramp and spasm: Secondary | ICD-10-CM | POA: Diagnosis present

## 2021-03-18 DIAGNOSIS — N179 Acute kidney failure, unspecified: Secondary | ICD-10-CM

## 2021-03-18 DIAGNOSIS — Z96641 Presence of right artificial hip joint: Secondary | ICD-10-CM | POA: Diagnosis not present

## 2021-03-18 DIAGNOSIS — Z87891 Personal history of nicotine dependence: Secondary | ICD-10-CM | POA: Insufficient documentation

## 2021-03-18 LAB — COMPREHENSIVE METABOLIC PANEL
ALT: 30 U/L (ref 0–44)
AST: 39 U/L (ref 15–41)
Albumin: 4.8 g/dL (ref 3.5–5.0)
Alkaline Phosphatase: 89 U/L (ref 38–126)
Anion gap: 14 (ref 5–15)
BUN: 20 mg/dL (ref 6–20)
CO2: 23 mmol/L (ref 22–32)
Calcium: 10.1 mg/dL (ref 8.9–10.3)
Chloride: 100 mmol/L (ref 98–111)
Creatinine, Ser: 2.32 mg/dL — ABNORMAL HIGH (ref 0.61–1.24)
GFR, Estimated: 32 mL/min — ABNORMAL LOW (ref >60)
Glucose, Bld: 106 mg/dL — ABNORMAL HIGH (ref 70–99)
Potassium: 4.1 mmol/L (ref 3.5–5.1)
Sodium: 137 mmol/L (ref 135–145)
Total Bilirubin: 1.1 mg/dL (ref 0.3–1.2)
Total Protein: 9.4 g/dL — ABNORMAL HIGH (ref 6.5–8.1)

## 2021-03-18 LAB — CBC
HCT: 47.2 % (ref 39.0–52.0)
Hemoglobin: 16.5 g/dL (ref 13.0–17.0)
MCH: 30.3 pg (ref 26.0–34.0)
MCHC: 35 g/dL (ref 30.0–36.0)
MCV: 86.6 fL (ref 80.0–100.0)
Platelets: 325 10*3/uL (ref 150–400)
RBC: 5.45 MIL/uL (ref 4.22–5.81)
RDW: 14 % (ref 11.5–15.5)
WBC: 15.1 10*3/uL — ABNORMAL HIGH (ref 4.0–10.5)
nRBC: 0 % (ref 0.0–0.2)

## 2021-03-18 LAB — TROPONIN I (HIGH SENSITIVITY)
Troponin I (High Sensitivity): 8 ng/L (ref <18)
Troponin I (High Sensitivity): 7 ng/L (ref <18)

## 2021-03-18 LAB — LIPASE, BLOOD: Lipase: 24 U/L (ref 11–51)

## 2021-03-18 LAB — MAGNESIUM: Magnesium: 2.3 mg/dL (ref 1.7–2.4)

## 2021-03-18 LAB — CK: Total CK: 843 U/L — ABNORMAL HIGH (ref 49–397)

## 2021-03-18 MED ORDER — ONDANSETRON HCL 4 MG/2ML IJ SOLN: 4.0000 mg | Freq: Once | INTRAMUSCULAR | Status: DC | PRN

## 2021-03-18 MED ORDER — SODIUM CHLORIDE 0.9 % IV BOLUS
1000.0000 mL | Freq: Once | INTRAVENOUS | Status: AC
  Administered 2021-03-18: 1000 mL via INTRAVENOUS

## 2021-03-18 MED ORDER — ONDANSETRON HCL 4 MG/2ML IJ SOLN: 4.0000 mg | Freq: Once | INTRAMUSCULAR | Status: DC

## 2021-03-18 MED ORDER — SODIUM CHLORIDE 0.9 % IV BOLUS
1000.0000 mL | Freq: Once | INTRAVENOUS | Status: AC
Start: 2021-03-18 — End: 2021-03-18
  Administered 2021-03-18: 1000 mL via INTRAVENOUS

## 2021-03-18 NOTE — ED Provider Notes (Signed)
60 year old male received at sign out from Joshua Baldwin pending UA and troponin. Per her HPI:   "Joshua Baldwin is a 60 y.o. male with no significant past medical history presents to the ED due to full body muscle cramps and nausea that started just prior to arrival.  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  Patient states he believes he "overexerted himself".  Patient notes he drank water and Gatorade with no relief in symptoms.  Patient endorses nausea however, denies vomiting.  Chart reviewed.  Patient had a similar episode in June 2020 where he was diagnosed with rhabdomyolysis. He also admits to 1 episode of left-sided sharp pain just prior to arrival.  No current chest pain.  No cardiac history.  No history of blood clots or recent surgeries or recent long immobilization, and hormonal treatments.  Denies associated shortness of breath, nausea, vomiting, and diaphoresis.   History obtained from patient and past medical records. No interpreter used during encounter."   Physical Exam  BP 106/78   Pulse (!) 58   Temp 98.3 F (36.8 C) (Oral)   Resp 16   SpO2 94%   Physical Exam Vitals and nursing note reviewed.  Constitutional:      Appearance: He is well-developed. He is not ill-appearing or diaphoretic.  HENT:     Head: Normocephalic and atraumatic.  Pulmonary:     Effort: Pulmonary effort is normal. No respiratory distress.     Breath sounds: No stridor. No wheezing, rhonchi or rales.  Chest:     Chest wall: No tenderness.  Abdominal:     Tenderness: There is no abdominal tenderness. There is no right CVA tenderness, left CVA tenderness, guarding or rebound.     Hernia: No hernia is present.  Musculoskeletal:     Cervical back: Neck supple.     Right lower leg: No edema.     Left lower leg: No edema.  Neurological:     Mental Status: He is alert and oriented to person, place, and time.     Cranial Nerves: No cranial nerve deficit.  Psychiatric:         Behavior: Behavior normal.    ED Course/Procedures   Clinical Course as of 03/19/21 0647  Wed Mar 18, 2021  2014 CK Total(!): 843 [CA]  2122 Creatinine(!): 2.32 [CA]    Clinical Course User Index [CA] Mannie Stabile, PA-C    Procedures  MDM   60 year old male received in signout from [place] pending UA and troponin.  Please see her note for further work-up and medical decision making.  In brief, this is a 60 year old male who presented with myalgias and muscle cramps, nausea that began earlier today after working long hours out in the heat.  Troponin was added on after the patient had 1 brief episode of left-sided chest pain just prior to arrival.  No chest pain at this time.  Vital signs are stable.  Labs and imaging of been reviewed and independently interpreted by me.  UA is unremarkable.  Troponin is not elevated.  Initially, patient had CK of 843 and Cr 2.32.  He did previously have creatinine of 1.64 in 2020, but this was during an episode of nontraumatic rhabdomyolysis.  Prior to nontraumatic rhabdomyolysis, patient's baseline creatinine was 0.9.  I did discuss this with the patient at bedside and he is followed by a PCP at [facility].  He did have labs earlier this year, but cannot recall  the results and does not recall his PCP expressing any concerns about his kidney function.  After discussing the patient with Dr. Bebe Shaggy, a third IV fluid bolus was given and CK and creatinine levels were rechecked.  Repeat creatinine 1.45.  Although repeat CK was only 814, no indication for admission.  Patient reports that he is feeling markedly improved.  He is having no further muscle cramps.  We will plan for discharge to home with outpatient follow-up to primary care.  Home supportive care discussed.  ER return precautions given.  He is hemodynamically stable in no acute distress.  Safer discharge home with outpatient follow-up as discussed.    Frederik Pear A,  PA-C 03/19/21 5400    Zadie Rhine, MD 03/20/21 [phone]

## 2021-03-18 NOTE — ED Provider Notes (Signed)
[facility]-EMERGENCY DEPT Provider Note   CSN: [id number] Arrival date & time: 03/18/21  1942     History No chief complaint on file.   Joshua Baldwin is a 60 y.o. male with no significant past medical history presents to the ED due to full body muscle cramps and nausea that started just prior to arrival.  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  Patient states he believes he "overexerted himself".  Patient notes he drank water and Gatorade with no relief in symptoms.  Patient endorses nausea however, denies vomiting.  Chart reviewed.  Patient had a similar episode in June 2020 where he was diagnosed with rhabdomyolysis. He also admits to 1 episode of left-sided sharp pain just prior to arrival.  No current chest pain.  No cardiac history.  No history of blood clots or recent surgeries or recent long immobilization, and hormonal treatments.  Denies associated shortness of breath, nausea, vomiting, and diaphoresis.  History obtained from patient and past medical records. No interpreter used during encounter.       Past Medical History:  Diagnosis Date   DDD (degenerative disc disease), lumbar    GERD (gastroesophageal reflux disease)    Lung abnormality    ?cyst of lung recently seen on back film told by dr Gerlene Fee   Nicotine dependence    Osteoarthritis    Hips   Pneumonia    2017    Patient Active Problem List   Diagnosis Date Noted   GERD (gastroesophageal reflux disease) 10/25/2016   Chronic low back pain 10/25/2016   Primary osteoarthritis of right hip 10/25/2016   Early diverticulitis of the colon 04/23/2016   AKI (acute kidney injury) (HCC) 04/21/2016   Leukocytosis 04/21/2016   Nausea and vomiting 04/21/2016   Hypokalemia 04/21/2016   Liver cysts 04/21/2016   Acute renal failure (HCC) 04/21/2016   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 05/12/2012    Past Surgical History:  Procedure  Laterality Date   BACK SURGERY  04   TOTAL HIP ARTHROPLASTY Right 06/14/2017   Procedure: RIGHT TOTAL HIP ARTHROPLASTY ANTERIOR APPROACH;  Surgeon: Sheral Apley, MD;  Location: [facility] OR;  Service: Orthopedics;  Laterality: Right;       Family History  Problem Relation Age of Onset   Other Father        died of work related accident    Social History   Tobacco Use   Smoking status: Former    Packs/day: 0.25    Years: 10.00    Pack years: 2.50    Types: Cigarettes    Quit date: 05/26/2017    Years since quitting: 3.8   Smokeless tobacco: Never  Vaping Use   Vaping Use: Never used  Substance Use Topics   Alcohol use: No   Drug use: No    Home Medications Prior to Admission medications   Medication Sig Start Date End Date Taking? Authorizing Provider  albuterol (PROVENTIL HFA;VENTOLIN HFA) 108 (90 Base) MCG/ACT inhaler Inhale 1 puff into the lungs every 6 (six) hours as needed for wheezing or shortness of breath.    [provider]  amitriptyline (ELAVIL) 10 MG tablet LIMIT 1-2 TABLETS BY MOUTH EVERY EVENING IF TOLERATED 10/14/20 10/14/21  Ewing Schlein, MD  amitriptyline (ELAVIL) 10 MG tablet TAKE 1-2 TABLETS BY MOUTH EACH EVENING IF TOLERATED 09/16/20 09/16/21  Ewing Schlein, MD  amitriptyline (ELAVIL) 10 MG tablet LIMIT 1 TO 2 TABLETS BY MOUTH EACH EVENING  IF TOLERATED 08/19/20 08/19/21  Ewing Schleinrisp, Alastair, MD  amitriptyline (ELAVIL) 10 MG tablet LIMIT 1 - 2 TABLETS BY MOUTH EACH EVENING IF TOLERATED 07/22/20 07/22/21  Ewing Schleinrisp, Demarri, MD  amitriptyline (ELAVIL) 10 MG tablet Limit 1-2 tablets by mouth each evening if tolerated 11/11/20     amitriptyline (ELAVIL) 10 MG tablet Take 1 to 2 tablets by mouth each evening if tolerated 12/09/20     amitriptyline (ELAVIL) 10 MG tablet Limit 1-2 tablets by mouth each evening if tolerated 01/07/21     amitriptyline (ELAVIL) 10 MG tablet Limit 1-2 tablets by mouth each evening if tolerated 02/03/21     amitriptyline (ELAVIL) 10 MG tablet Take 1  to 2 tablets by mouth each evening if tolerated. 03/03/21     amoxicillin (AMOXIL) 500 MG capsule Take 1 capsule (500 mg total) by mouth 3 (three) times daily. 01/18/21     azithromycin (ZITHROMAX) 250 MG tablet Take by mouth as directed on package instructions 01/11/21     diclofenac Sodium (VOLTAREN) 1 % GEL APPLY 2-4 GRAMS TO PAINFUL AREA OF SKIN 2-4 TIMES A DAY IF TOLERATED 10/14/20 10/14/21  Ewing Schleinrisp, Anson, MD  diclofenac Sodium (VOLTAREN) 1 % GEL APPLY 2 TO 4 G TO PAINFUL AREA OF SKIN 2 TO 4 TIMES PER DAY IF TOLERATED. 08/19/20 08/19/21  Ewing Schleinrisp, Arne, MD  diclofenac Sodium (VOLTAREN) 1 % GEL APPLY 2 - 4 GRAMS TO PAINFUL AREA OF SKIN 2 - 4 TIMES DAILY IF TOLERATED 07/22/20 07/22/21  Ewing Schleinrisp, Worthy, MD  diclofenac Sodium (VOLTAREN) 1 % GEL Apply 2 to 4 grams onto painful area of skin 2 to 4 times a day if tolerated 11/11/20     diclofenac Sodium (VOLTAREN) 1 % GEL Apply 2 to 4 grams to painful area of the skin 2 to 4 times a day if tolerated 12/09/20     diclofenac Sodium (VOLTAREN) 1 % GEL Apply 2 - 4 grams to painful area(s) of skin 2 - 4 times per day if tolerated. 01/07/21     diclofenac Sodium (VOLTAREN) 1 % GEL Apply 2 - 4 g to painful area of skin 2 - 4 times per day if tolerated. 02/03/21     diclofenac Sodium (VOLTAREN) 1 % GEL Apply 2 - 4 grams to painful area of skin 2 - 4 times per day if tolerated. 03/03/21     dicyclomine (BENTYL) 20 MG tablet Take 1 tablet (20 mg total) by mouth 2 (two) times daily. 02/11/17   Trixie DredgeWest, Emily, PA-C  fluconazole (DIFLUCAN) 150 MG tablet TAKE 1 TABLET BY MOUTH ONCE A WEEK FOR 2 WEEKS 06/18/20 06/18/21  Loreta AveLewis, Andrea A, PA-C  methocarbamol (ROBAXIN) 500 MG tablet Take 1 tablet (500 mg total) every 6 (six) hours as needed by mouth for muscle spasms. 06/14/17   Martensen, Lucretia KernHenry Calvin III, PA-C  methocarbamol (ROBAXIN) 500 MG tablet LIMIT 1/2-1 TABLET BY MOUTH 2-4 TIMES A DAY IF TOLERATED (NO TIZANIDINE) 10/14/20 10/14/21  Ewing Schleinrisp, Kacyn, MD  methocarbamol (ROBAXIN) 500 MG tablet  LIMIT 1/2 - 1 TABLET BY MOUTH 2 - 4 TIMES PER DAY IF TOLERATED. NO TIZANIDINE (ZANAFLEX) 09/16/20 09/16/21  Ewing Schleinrisp, Heber, MD  methocarbamol (ROBAXIN) 500 MG tablet LIMIT 1/2 - 1 TABLET BY MOUTH 2 - 4 TIMES PER DAY IF TOLERATED. NO TIZANIDINE (ZANAFLEX) 08/19/20 08/19/21  Ewing Schleinrisp, Yasiel, MD  methocarbamol (ROBAXIN) 500 MG tablet LIMIT 1/2 - 1 TABLET BY MOUTH 2 - 4 TIMES A DAY IF TOLERATED (NO TIZANIDINE) 07/22/20 07/22/21  Ewing Schleinrisp, Keanen, MD  methocarbamol (ROBAXIN) 500 MG tablet TAKE 1/2 TO 1 TABLET BY MOUTH 2 TO 4 TIMES PER DAY IF TOLERATED ** NO TIZANIDINE 06/24/20 06/24/21  Ewing Schlein, MD  methocarbamol (ROBAXIN) 500 MG tablet LIMIT 1/2 TO 1 TABLET BY MOUTH 2 TO 4 TIMES PER DAY IF TOLERATED. NO TIZANIDINE (ZANAFLEX) WHILE TAKING THIS MEDICATION 05/28/20 05/28/21  Ewing Schlein, MD  methocarbamol (ROBAXIN) 500 MG tablet Limit to 1/2 to 1 tablet by mouth 2 to 4 times per day if tolerated ** No Tizanidine 11/11/20     methocarbamol (ROBAXIN) 500 MG tablet Take 1/2 to 1 tablet by mouth 2 to 4 times a day if tolerated. ** No tizanidine** 12/09/20     methocarbamol (ROBAXIN) 500 MG tablet Limit 1/2 - 1 tablet by mouth 2 - 4 times per day if tolerated. NO TIZANIDINE (ZANAFLEX) 02/03/21     omeprazole (PRILOSEC) 20 MG capsule Take 1 capsule (20 mg total) by mouth daily before breakfast. Patient taking differently: Take 20 mg by mouth 2 (two) times daily before a meal.  10/05/16   Iva Boop, MD  ondansetron (ZOFRAN) 4 MG tablet Take 1 tablet (4 mg total) every 8 (eight) hours as needed by mouth for nausea or vomiting. 06/14/17   Martensen, Lucretia Kern III, PA-C  Oxycodone HCl 10 MG TABS LIMIT 1 TABLET BY MOUTH 3 - 4 TIMES PER DAY IF TOLERATED . NO NUCYNTA ER 10/14/20 04/12/21  Ewing Schlein, MD  Oxycodone HCl 10 MG TABS Limit 1 tablet by mouth 2-3 times per day if tolerated 02/03/21     Oxycodone HCl 10 MG TABS Limit 1 tablet by mouth 2-3 times per day if tolerated ( completion of partial fill for 1 month  supply) 02/11/21     Oxycodone HCl 10 MG TABS Limit 1 tablet by mouth 2-3 times per day if tolerated. 03/03/21     oxyCODONE-acetaminophen (ROXICET) 5-325 MG tablet Take 1-2 tablets every 4 (four) hours as needed by mouth for severe pain. 06/14/17   Martensen, Lucretia Kern III, PA-C  predniSONE (STERAPRED UNI-PAK 21 TAB) 10 MG (21) TBPK tablet Use as directed per instructions in pack 01/18/21     pregabalin (LYRICA) 100 MG capsule TAKE 1-2 CAPSULES BY MOUTH 2-3 TIMES PER DAY IF TOLERATED.(NOTE THIS IS 100MG  CAPSULE). DO NOT TAKE IF DRIVING OR OPERATING HEAVY MACHINERY 09/16/20 03/15/21  05/15/21, MD  pregabalin (LYRICA) 100 MG capsule LIMIT 1 - 2 CAPSULES BY MOUTH 2 - 3 TIMES PER DAY IF TOLERATED. 07/22/20 01/18/21  03/20/21, MD  pregabalin (LYRICA) 100 MG capsule TAKE 1 TO 2 CAPSULES BY MOUTH 2 TO 3 TIMES PER DAY IF TOLERATED 06/24/20 12/21/20  12/23/20, MD  pregabalin (LYRICA) 100 MG capsule LIMIT 1-2 CAPSULES BY MOUTH 2-3 TIMES PER DAY IF TOLERATED. DO NOT TAKE MEDICATION IF DRIVING. Ewing Schlein 01/77/93  04/12/99, MD  pregabalin (LYRICA) 100 MG capsule LIMIT TO TAKING 1-2 CAPSULES BY MOUTH 2-3 TIMES DAILY IF TOLERATED. (NOTE THIS IS 100MG  CAPSULE) 10/14/20 04/12/21  12/14/20, MD  pregabalin (LYRICA) 100 MG capsule Limit 1-2 capsules by mouth 2-3 times per day if tolerated. NOTE CAPSULE IS 100 MG NO LYRICA 75 MG 11/11/20     pregabalin (LYRICA) 100 MG capsule Take 1 to 2 capsules by mouth 2 to 3 times a day if tolerated 12/09/20     pregabalin (LYRICA) 100 MG capsule Limit 1-2 capsules by mouth 2-3 times per day if tolerated. NOTE CAPSULE IS 100 MG NO LYRICA 75  MG 01/07/21     pregabalin (LYRICA) 100 MG capsule Limit 1 capsule by mouth 2-3 times per day if tolerated. NOTE CAPSULE IS 100 MG NO LYRICA 75 MG 02/03/21     pregabalin (LYRICA) 100 MG capsule Take 1 capsule by mouth 2 - 3 times per day if tolerated. 03/03/21     rivaroxaban (XARELTO) 10 MG TABS tablet Take 1 tablet (10 mg total) daily  by mouth. For 30 days for DVT prophylaxis 06/14/17   Albina Billet III, PA-C  sucralfate (CARAFATE) 1 GM/10ML suspension Take 1 g by mouth 2 (two) times daily.     [provider]  tiZANidine (ZANAFLEX) 4 MG tablet Take 4 mg by mouth 2 (two) times daily.  06/01/16   [provider]    Allergies    Gadolinium derivatives and Mrv [mixed respiratory bacterial vaccine]  Review of Systems   Review of Systems  Respiratory:  Negative for shortness of breath.   Cardiovascular:  Positive for chest pain.  Gastrointestinal:  Positive for nausea. Negative for abdominal pain, diarrhea and vomiting.  Musculoskeletal:  Positive for myalgias.   Physical Exam Updated Vital Signs BP 115/77   Pulse 73   Temp 98.3 F (36.8 C) (Oral)   Resp 20   SpO2 93%   Physical Exam Vitals and nursing note reviewed.  Constitutional:      General: He is not in acute distress.    Appearance: He is not ill-appearing.  HENT:     Head: Normocephalic.  Eyes:     Pupils: Pupils are equal, round, and reactive to light.  Cardiovascular:     Rate and Rhythm: Normal rate and regular rhythm.     Pulses: Normal pulses.     Heart sounds: Normal heart sounds. No murmur heard.   No friction rub. No gallop.  Pulmonary:     Effort: Pulmonary effort is normal.     Breath sounds: Normal breath sounds.  Abdominal:     General: Abdomen is flat. There is no distension.     Palpations: Abdomen is soft.     Tenderness: There is no abdominal tenderness. There is no guarding or rebound.  Musculoskeletal:        General: Normal range of motion.     Cervical back: Neck supple.  Skin:    General: Skin is warm and dry.  Neurological:     General: No focal deficit present.     Mental Status: He is alert.  Psychiatric:        Mood and Affect: Mood normal.        Behavior: Behavior normal.    ED Results / Procedures / Treatments   Labs (all labs ordered are listed, but only abnormal results are  displayed) Labs Reviewed  COMPREHENSIVE METABOLIC PANEL - Abnormal; Notable for the following components:      Result Value   Glucose, Bld 106 (*)    Creatinine, Ser 2.32 (*)    Total Protein 9.4 (*)    GFR, Estimated 32 (*)    All other components within normal limits  CBC - Abnormal; Notable for the following components:   WBC 15.1 (*)    All other components within normal limits  CK - Abnormal; Notable for the following components:   Total CK 843 (*)    All other components within normal limits  LIPASE, BLOOD  MAGNESIUM  URINALYSIS, ROUTINE W REFLEX MICROSCOPIC  TROPONIN I (HIGH SENSITIVITY)  TROPONIN I (HIGH SENSITIVITY)  EKG EKG Interpretation  Date/Time:  Wednesday March 18 2021 20:06:17 EDT Ventricular Rate:  70 PR Interval:  127 QRS Duration: 96 QT Interval:  376 QTC Calculation: 406 R Axis:   60 Text Interpretation: Sinus rhythm Nonspecific ST abnormality no reciprocal changes No STEMI Confirmed by Ernie Avena ([id number]) on 03/18/2021 10:50:34 PM  Radiology DG Chest Portable 1 View  Result Date: 03/18/2021 CLINICAL DATA:  Chest pain EXAM: PORTABLE CHEST 1 VIEW COMPARISON:  02/09/2017 FINDINGS: The heart size and mediastinal contours are within normal limits. Both lungs are clear. The visualized skeletal structures are unremarkable. IMPRESSION: No active disease. Electronically Signed   By: Jasmine Pang M.D.   On: 03/18/2021 20:47    Procedures Procedures   Medications Ordered in ED Medications  ondansetron [facility]) injection 4 mg (0 mg Intravenous Hold 03/18/21 2032)  sodium chloride 0.9 % bolus 1,000 mL (0 mLs Intravenous Stopped 03/18/21 2140)  sodium chloride 0.9 % bolus 1,000 mL (0 mLs Intravenous Stopped 03/18/21 2300)    ED Course  I have reviewed the triage vital signs and the nursing notes.  Pertinent labs & imaging results that were available during my care of the patient were reviewed by me and considered in my medical decision making (see chart  for details).  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  Patient has a history of nontraumatic rhabdomyolysis. He also endorses one episode of sharp, left-sided chest pain just prior to arrival. Upon arrival, patient afebrile, not tachycardic or hypoxic.  Patient in no acute distress.  Physical exam reassuring.  Routine labs ordered.  CK and magnesium added.  Added troponin, EKG, chest x-ray due to 1 episode of chest pain just prior to arrival. Chest pain appears atypical for ACS. Low suspicion for PE/DVT or dissection given presentation.  CBC significant for mild leukocytosis of 15.1.  Normal hemoglobin.  Lipase normal.  Initial troponin normal at 8.  Will obtain delta troponin to rule out ACS.  Magnesium normal.  CK mildly elevated at 843.  CMP significant for elevated creatinine at 2.32.  Normal BUN. Unsure what his baseline creatinine is given no recent labs.  Chest x-ray personally reviewed which is negative for signs of pneumonia, pneumothorax, or widened mediastinum.  EKG demonstrates normal sinus rhythm.  No signs of acute ischemia.  Patient handed off to [facility], PA-C at shift change pending UA and delta troponin. If normal, patient may be discharged home.  Final Clinical Impression(s) / ED Diagnoses Final diagnoses:  Muscle cramps    Rx / DC Orders ED Discharge Orders     None        Jesusita Oka 03/18/21 2350    Ernie Avena, MD 03/19/21 [phone]

## 2021-03-18 NOTE — Discharge Instructions (Addendum)
Thank you for allowing me to care for you today in the Emergency Department.   You were seen today for muscle cramps and dehydration.  You were given multiple liters of fluids.  Initially, your creatinine level, which is a marker for how your kidneys are functioning, was very elevated.  After several liters of fluids, this improved.  Please follow-up with your primary care provider for a recheck of your symptoms within the next week.  Please make sure that you are drinking plenty of fluids by mouth over the next few days.  If you have worsening muscle cramps, if you stop making urine, if your urine is brown and you are having severe pain, if you pass out, or have other new, concerning symptoms, please return to the emergency department for further evaluation.

## 2021-03-18 NOTE — ED Triage Notes (Signed)
Pt arriving via EMS from home with muscle cramps and emesis. Pt reports he has been outside quite a bit today but says he had lots of water and gatorade. Pt also reports that the last time this happened to him, his K+ was low.

## 2021-03-19 LAB — URINALYSIS, ROUTINE W REFLEX MICROSCOPIC
Bilirubin Urine: NEGATIVE
Glucose, UA: NEGATIVE mg/dL
Hgb urine dipstick: NEGATIVE
Ketones, ur: NEGATIVE mg/dL
Leukocytes,Ua: NEGATIVE
Nitrite: NEGATIVE
Protein, ur: NEGATIVE mg/dL
Specific Gravity, Urine: 1.013 (ref 1.005–1.030)
pH: 5 (ref 5.0–8.0)

## 2021-03-19 LAB — BASIC METABOLIC PANEL
Anion gap: 9 (ref 5–15)
BUN: 18 mg/dL (ref 6–20)
CO2: 23 mmol/L (ref 22–32)
Calcium: 8.5 mg/dL — ABNORMAL LOW (ref 8.9–10.3)
Chloride: 106 mmol/L (ref 98–111)
Creatinine, Ser: 1.45 mg/dL — ABNORMAL HIGH (ref 0.61–1.24)
GFR, Estimated: 56 mL/min — ABNORMAL LOW (ref >60)
Glucose, Bld: 106 mg/dL — ABNORMAL HIGH (ref 70–99)
Potassium: 3.9 mmol/L (ref 3.5–5.1)
Sodium: 138 mmol/L (ref 135–145)

## 2021-03-19 LAB — CK: Total CK: 814 U/L — ABNORMAL HIGH (ref 49–397)

## 2021-03-19 MED ORDER — LACTATED RINGERS IV BOLUS
1000.0000 mL | Freq: Once | INTRAVENOUS | Status: AC
  Administered 2021-03-19: 1000 mL via INTRAVENOUS

## 2021-03-24 ENCOUNTER — Other Ambulatory Visit (HOSPITAL_COMMUNITY): Payer: Self-pay

## 2021-03-31 ENCOUNTER — Other Ambulatory Visit (HOSPITAL_COMMUNITY): Payer: Self-pay

## 2021-03-31 MED ORDER — AMITRIPTYLINE HCL 10 MG PO TABS
ORAL_TABLET | ORAL | 1 refills | Status: AC
  Filled 2021-03-31: qty 50, 30d supply, fill #0

## 2021-03-31 MED ORDER — DICLOFENAC SODIUM 1 % EX GEL
CUTANEOUS | 2 refills | Status: AC
  Filled 2021-03-31: qty 500, 31d supply, fill #0

## 2021-03-31 MED ORDER — PREGABALIN 100 MG PO CAPS
ORAL_CAPSULE | ORAL | 0 refills | Status: AC
  Filled 2021-03-31: qty 100, 30d supply, fill #0

## 2021-03-31 MED ORDER — METHOCARBAMOL 500 MG PO TABS
ORAL_TABLET | ORAL | 2 refills | Status: AC
  Filled 2021-03-31: qty 100, 25d supply, fill #0

## 2021-03-31 MED ORDER — OXYCODONE HCL 10 MG PO TABS
ORAL_TABLET | ORAL | 0 refills | Status: AC
  Filled 2021-03-31: qty 80, 30d supply, fill #0

## 2021-04-06 ENCOUNTER — Other Ambulatory Visit (HOSPITAL_COMMUNITY): Payer: Self-pay

## 2021-04-08 ENCOUNTER — Other Ambulatory Visit (HOSPITAL_COMMUNITY): Payer: Self-pay

## 2021-04-09 ENCOUNTER — Other Ambulatory Visit (HOSPITAL_COMMUNITY): Payer: Self-pay

## 2021-04-28 ENCOUNTER — Other Ambulatory Visit (HOSPITAL_COMMUNITY): Payer: Self-pay

## 2021-04-28 MED ORDER — AMITRIPTYLINE HCL 10 MG PO TABS
ORAL_TABLET | ORAL | 1 refills | Status: AC
  Filled 2021-04-28: qty 50, 30d supply, fill #0

## 2021-04-28 MED ORDER — METHOCARBAMOL 500 MG PO TABS
ORAL_TABLET | ORAL | 2 refills | Status: AC
  Filled 2021-04-28: qty 120, 30d supply, fill #0

## 2021-04-28 MED ORDER — OXYCODONE HCL 10 MG PO TABS
ORAL_TABLET | ORAL | 0 refills | Status: AC
  Filled 2021-04-28: qty 90, 30d supply, fill #0

## 2021-04-28 MED ORDER — PREGABALIN 100 MG PO CAPS
ORAL_CAPSULE | ORAL | 0 refills | Status: AC
  Filled 2021-04-28: qty 140, 30d supply, fill #0

## 2021-04-28 MED ORDER — DICLOFENAC SODIUM 1 % EX GEL
CUTANEOUS | 2 refills | Status: AC
  Filled 2021-04-28: qty 500, 30d supply, fill #0

## 2021-05-26 ENCOUNTER — Other Ambulatory Visit (HOSPITAL_COMMUNITY): Payer: Self-pay

## 2021-05-26 MED ORDER — OXYCODONE HCL 10 MG PO TABS
ORAL_TABLET | ORAL | 0 refills | Status: AC
  Filled 2021-05-26: qty 60, 30d supply, fill #0

## 2021-05-26 MED ORDER — DICLOFENAC SODIUM 1 % EX GEL
CUTANEOUS | 2 refills | Status: AC
  Filled 2021-05-26: qty 500, 31d supply, fill #0

## 2021-05-26 MED ORDER — PREGABALIN 100 MG PO CAPS
ORAL_CAPSULE | ORAL | 0 refills | Status: AC
  Filled 2021-05-26: qty 140, 28d supply, fill #0

## 2021-05-26 MED ORDER — METHOCARBAMOL 500 MG PO TABS
ORAL_TABLET | ORAL | 2 refills | Status: AC
  Filled 2021-05-26: qty 120, 30d supply, fill #0

## 2021-06-23 ENCOUNTER — Other Ambulatory Visit (HOSPITAL_COMMUNITY): Payer: Self-pay

## 2021-06-23 MED ORDER — PREGABALIN 100 MG PO CAPS
ORAL_CAPSULE | ORAL | 0 refills | Status: AC
  Filled 2021-06-23: qty 140, 30d supply, fill #0

## 2021-06-23 MED ORDER — DICLOFENAC SODIUM 1 % EX GEL
CUTANEOUS | 2 refills | Status: AC
  Filled 2021-06-23: qty 500, 30d supply, fill #0

## 2021-06-23 MED ORDER — OXYCODONE HCL 10 MG PO TABS
ORAL_TABLET | ORAL | 0 refills | Status: AC
  Filled 2021-06-23: qty 60, 30d supply, fill #0

## 2021-06-23 MED ORDER — AMITRIPTYLINE HCL 10 MG PO TABS
ORAL_TABLET | ORAL | 0 refills | Status: AC
  Filled 2021-06-23: qty 50, 25d supply, fill #0

## 2021-06-23 MED ORDER — METHOCARBAMOL 500 MG PO TABS
ORAL_TABLET | ORAL | 2 refills | Status: AC
  Filled 2021-06-23: qty 120, 30d supply, fill #0

## 2021-07-21 ENCOUNTER — Other Ambulatory Visit (HOSPITAL_COMMUNITY): Payer: Self-pay

## 2021-07-21 MED ORDER — OXYCODONE HCL 10 MG PO TABS
ORAL_TABLET | ORAL | 0 refills | Status: DC
  Filled 2021-07-21: qty 80, 26d supply, fill #0

## 2021-07-21 MED ORDER — AMITRIPTYLINE HCL 10 MG PO TABS
ORAL_TABLET | ORAL | 0 refills | Status: AC
  Filled 2021-07-21: qty 50, 25d supply, fill #0

## 2021-07-21 MED ORDER — NALOXONE HCL 4 MG/0.1ML NA LIQD
NASAL | 0 refills | Status: AC
  Filled 2021-07-21: qty 2, 1d supply, fill #0

## 2021-07-21 MED ORDER — METHOCARBAMOL 500 MG PO TABS
ORAL_TABLET | ORAL | 2 refills | Status: AC
  Filled 2021-07-21: qty 120, 30d supply, fill #0

## 2021-07-21 MED ORDER — PREGABALIN 100 MG PO CAPS
ORAL_CAPSULE | ORAL | 0 refills | Status: AC
  Filled 2021-07-21: qty 140, 28d supply, fill #0

## 2021-08-18 ENCOUNTER — Other Ambulatory Visit (HOSPITAL_COMMUNITY): Payer: Self-pay

## 2021-08-18 MED ORDER — OXYCODONE HCL 10 MG PO TABS
ORAL_TABLET | ORAL | 0 refills | Status: DC
  Filled 2021-08-18: qty 80, 26d supply, fill #0

## 2021-08-18 MED ORDER — PREGABALIN 100 MG PO CAPS
ORAL_CAPSULE | ORAL | 0 refills | Status: AC
  Filled 2021-08-18: qty 140, 30d supply, fill #0

## 2021-08-18 MED ORDER — METHOCARBAMOL 500 MG PO TABS
ORAL_TABLET | ORAL | 2 refills | Status: AC
  Filled 2021-08-18: qty 120, 30d supply, fill #0

## 2021-08-18 MED ORDER — DICLOFENAC SODIUM 1 % EX GEL
CUTANEOUS | 2 refills | Status: AC
  Filled 2021-08-18: qty 300, 19d supply, fill #0

## 2021-09-15 ENCOUNTER — Other Ambulatory Visit (HOSPITAL_COMMUNITY): Payer: Self-pay

## 2021-09-15 MED ORDER — AMITRIPTYLINE HCL 10 MG PO TABS
ORAL_TABLET | ORAL | 0 refills | Status: AC
  Filled 2021-09-15: qty 50, 30d supply, fill #0

## 2021-09-15 MED ORDER — OXYCODONE HCL 10 MG PO TABS
ORAL_TABLET | ORAL | 0 refills | Status: DC
  Filled 2021-09-15: qty 80, 30d supply, fill #0

## 2021-09-15 MED ORDER — PREGABALIN 100 MG PO CAPS: ORAL_CAPSULE | ORAL | 0 refills | Status: DC

## 2021-09-15 MED ORDER — DICLOFENAC SODIUM 1 % EX GEL: CUTANEOUS | 2 refills | Status: DC
